# Patient Record
Sex: Male | Born: 1975 | Race: White | Hispanic: No | Marital: Married | Smoking: Former smoker
Health system: Southern US, Community
[De-identification: ages and names within clinical notes are randomized; demographics above are authoritative.]

## PROBLEM LIST (undated history)

## (undated) DIAGNOSIS — N289 Disorder of kidney and ureter, unspecified: Secondary | ICD-10-CM

## (undated) DIAGNOSIS — IMO0002 Reserved for concepts with insufficient information to code with codable children: Secondary | ICD-10-CM

## (undated) DIAGNOSIS — G8929 Other chronic pain: Secondary | ICD-10-CM

## (undated) DIAGNOSIS — Z951 Presence of aortocoronary bypass graft: Secondary | ICD-10-CM

## (undated) DIAGNOSIS — I2510 Atherosclerotic heart disease of native coronary artery without angina pectoris: Secondary | ICD-10-CM

## (undated) DIAGNOSIS — M549 Dorsalgia, unspecified: Secondary | ICD-10-CM

## (undated) DIAGNOSIS — I219 Acute myocardial infarction, unspecified: Secondary | ICD-10-CM

## (undated) HISTORY — PX: CORONARY ARTERY BYPASS GRAFT: SHX141

---

## 2005-04-26 DIAGNOSIS — I219 Acute myocardial infarction, unspecified: Secondary | ICD-10-CM

## 2005-04-26 HISTORY — DX: Acute myocardial infarction, unspecified: I21.9

## 2008-10-16 ENCOUNTER — Emergency Department (HOSPITAL_COMMUNITY): Admission: EM | Admit: 2008-10-16 | Discharge: 2008-10-16 | Payer: Self-pay | Admitting: Emergency Medicine

## 2008-10-29 ENCOUNTER — Emergency Department (HOSPITAL_COMMUNITY): Admission: EM | Admit: 2008-10-29 | Discharge: 2008-10-29 | Payer: Self-pay | Admitting: Internal Medicine

## 2009-01-14 ENCOUNTER — Emergency Department (HOSPITAL_COMMUNITY): Admission: EM | Admit: 2009-01-14 | Discharge: 2009-01-14 | Payer: Self-pay | Admitting: Emergency Medicine

## 2009-01-15 ENCOUNTER — Ambulatory Visit (HOSPITAL_COMMUNITY): Admission: RE | Admit: 2009-01-15 | Discharge: 2009-01-15 | Payer: Self-pay | Admitting: Emergency Medicine

## 2009-10-03 ENCOUNTER — Emergency Department (HOSPITAL_COMMUNITY): Admission: EM | Admit: 2009-10-03 | Discharge: 2009-10-03 | Payer: Self-pay | Admitting: Emergency Medicine

## 2009-10-15 ENCOUNTER — Emergency Department (HOSPITAL_COMMUNITY): Admission: AD | Admit: 2009-10-15 | Discharge: 2009-10-15 | Payer: Self-pay | Admitting: Urology

## 2009-10-20 ENCOUNTER — Encounter (HOSPITAL_COMMUNITY): Admission: RE | Admit: 2009-10-20 | Discharge: 2009-10-20 | Payer: Self-pay | Admitting: Urology

## 2009-10-20 ENCOUNTER — Encounter (INDEPENDENT_AMBULATORY_CARE_PROVIDER_SITE_OTHER): Payer: Self-pay | Admitting: Cardiology

## 2009-10-20 ENCOUNTER — Ambulatory Visit (HOSPITAL_COMMUNITY): Admission: RE | Admit: 2009-10-20 | Discharge: 2009-10-20 | Payer: Self-pay | Admitting: Cardiology

## 2009-10-28 ENCOUNTER — Ambulatory Visit (HOSPITAL_COMMUNITY): Admission: RE | Admit: 2009-10-28 | Discharge: 2009-10-28 | Payer: Self-pay | Admitting: Urology

## 2009-11-06 ENCOUNTER — Ambulatory Visit (HOSPITAL_COMMUNITY): Admission: RE | Admit: 2009-11-06 | Discharge: 2009-11-06 | Payer: Self-pay | Admitting: Urology

## 2010-03-26 ENCOUNTER — Emergency Department (HOSPITAL_COMMUNITY)
Admission: EM | Admit: 2010-03-26 | Discharge: 2010-03-26 | Payer: Self-pay | Source: Home / Self Care | Admitting: Emergency Medicine

## 2010-04-23 ENCOUNTER — Ambulatory Visit (HOSPITAL_COMMUNITY)
Admission: RE | Admit: 2010-04-23 | Discharge: 2010-04-23 | Payer: Self-pay | Source: Home / Self Care | Attending: Urology | Admitting: Urology

## 2010-04-29 ENCOUNTER — Inpatient Hospital Stay (HOSPITAL_COMMUNITY): Admission: EM | Admit: 2010-04-29 | Discharge: 2010-05-01 | Payer: Self-pay | Source: Home / Self Care

## 2010-04-29 LAB — CBC
HCT: 36.7 % — ABNORMAL LOW (ref 39.0–52.0)
Hemoglobin: 12.8 g/dL — ABNORMAL LOW (ref 13.0–17.0)
MCH: 31 pg (ref 26.0–34.0)
MCHC: 34.9 g/dL (ref 30.0–36.0)
MCV: 88.9 fL (ref 78.0–100.0)
Platelets: 303 10*3/uL (ref 150–400)
RBC: 4.13 MIL/uL — ABNORMAL LOW (ref 4.22–5.81)
RDW: 12.4 % (ref 11.5–15.5)
WBC: 15.5 10*3/uL — ABNORMAL HIGH (ref 4.0–10.5)

## 2010-04-29 LAB — BASIC METABOLIC PANEL
BUN: 29 mg/dL — ABNORMAL HIGH (ref 6–23)
CO2: 27 mEq/L (ref 19–32)
Calcium: 9.9 mg/dL (ref 8.4–10.5)
Chloride: 101 mEq/L (ref 96–112)
Creatinine, Ser: 0.88 mg/dL (ref 0.4–1.5)
GFR calc Af Amer: >60 mL/min (ref >60)
GFR calc non Af Amer: >60 mL/min (ref >60)
Glucose, Bld: 113 mg/dL — ABNORMAL HIGH (ref 70–99)
Potassium: 4.2 mEq/L (ref 3.5–5.1)
Sodium: 138 mEq/L (ref 135–145)

## 2010-04-29 LAB — PROTIME-INR
INR: 0.96 (ref 0.00–1.49)
Prothrombin Time: 13 seconds (ref 11.6–15.2)

## 2010-04-29 LAB — DIFFERENTIAL
Basophils Absolute: 0 10*3/uL (ref 0.0–0.1)
Basophils Relative: 0 % (ref 0–1)
Eosinophils Absolute: 0.1 10*3/uL (ref 0.0–0.7)
Eosinophils Relative: 1 % (ref 0–5)
Lymphocytes Relative: 28 % (ref 12–46)
Lymphs Abs: 4.3 10*3/uL — ABNORMAL HIGH (ref 0.7–4.0)
Monocytes Absolute: 1.1 10*3/uL — ABNORMAL HIGH (ref 0.1–1.0)
Monocytes Relative: 7 % (ref 3–12)
Neutro Abs: 10 10*3/uL — ABNORMAL HIGH (ref 1.7–7.7)
Neutrophils Relative %: 64 % (ref 43–77)

## 2010-04-29 LAB — TYPE AND SCREEN
ABO/RH(D): O POS
Antibody Screen: NEGATIVE

## 2010-04-29 LAB — APTT: aPTT: 29 seconds (ref 24–37)

## 2010-04-30 LAB — DIFFERENTIAL
Basophils Absolute: 0.1 10*3/uL (ref 0.0–0.1)
Basophils Absolute: 0 10*3/uL (ref 0.0–0.1)
Basophils Relative: 1 % (ref 0–1)
Basophils Relative: 0 % (ref 0–1)
Eosinophils Absolute: 0.2 10*3/uL (ref 0.0–0.7)
Eosinophils Absolute: 0.1 10*3/uL (ref 0.0–0.7)
Eosinophils Relative: 2 % (ref 0–5)
Eosinophils Relative: 2 % (ref 0–5)
Lymphocytes Relative: 45 % (ref 12–46)
Lymphocytes Relative: 33 % (ref 12–46)
Lymphs Abs: 4.1 10*3/uL — ABNORMAL HIGH (ref 0.7–4.0)
Lymphs Abs: 3.1 10*3/uL (ref 0.7–4.0)
Monocytes Absolute: 0.6 10*3/uL (ref 0.1–1.0)
Monocytes Absolute: 0.7 10*3/uL (ref 0.1–1.0)
Monocytes Relative: 7 % (ref 3–12)
Monocytes Relative: 7 % (ref 3–12)
Neutro Abs: 4.2 10*3/uL (ref 1.7–7.7)
Neutro Abs: 5.5 10*3/uL (ref 1.7–7.7)
Neutrophils Relative %: 45 % (ref 43–77)
Neutrophils Relative %: 59 % (ref 43–77)

## 2010-04-30 LAB — CBC
HCT: 26.6 % — ABNORMAL LOW (ref 39.0–52.0)
HCT: 25.8 % — ABNORMAL LOW (ref 39.0–52.0)
Hemoglobin: 9.3 g/dL — ABNORMAL LOW (ref 13.0–17.0)
Hemoglobin: 9.1 g/dL — ABNORMAL LOW (ref 13.0–17.0)
MCH: 31.2 pg (ref 26.0–34.0)
MCH: 31.5 pg (ref 26.0–34.0)
MCHC: 35 g/dL (ref 30.0–36.0)
MCHC: 35.3 g/dL (ref 30.0–36.0)
MCV: 89.3 fL (ref 78.0–100.0)
MCV: 89.3 fL (ref 78.0–100.0)
Platelets: 217 10*3/uL (ref 150–400)
Platelets: 220 10*3/uL (ref 150–400)
RBC: 2.98 MIL/uL — ABNORMAL LOW (ref 4.22–5.81)
RBC: 2.89 MIL/uL — ABNORMAL LOW (ref 4.22–5.81)
RDW: 12.4 % (ref 11.5–15.5)
RDW: 12.4 % (ref 11.5–15.5)
WBC: 9.2 10*3/uL (ref 4.0–10.5)
WBC: 9.5 10*3/uL (ref 4.0–10.5)

## 2010-05-01 LAB — CBC
HCT: 25.1 % — ABNORMAL LOW (ref 39.0–52.0)
Hemoglobin: 8.9 g/dL — ABNORMAL LOW (ref 13.0–17.0)
MCH: 31.8 pg (ref 26.0–34.0)
MCHC: 35.5 g/dL (ref 30.0–36.0)
MCV: 89.6 fL (ref 78.0–100.0)
Platelets: 219 10*3/uL (ref 150–400)
RBC: 2.8 MIL/uL — ABNORMAL LOW (ref 4.22–5.81)
RDW: 12.2 % (ref 11.5–15.5)
WBC: 9.2 10*3/uL (ref 4.0–10.5)

## 2010-05-01 LAB — COMPREHENSIVE METABOLIC PANEL
ALT: 23 U/L (ref 0–53)
AST: 17 U/L (ref 0–37)
Albumin: 3 g/dL — ABNORMAL LOW (ref 3.5–5.2)
Alkaline Phosphatase: 57 U/L (ref 39–117)
BUN: 10 mg/dL (ref 6–23)
CO2: 29 mEq/L (ref 19–32)
Calcium: 8.4 mg/dL (ref 8.4–10.5)
Chloride: 105 mEq/L (ref 96–112)
Creatinine, Ser: 0.73 mg/dL (ref 0.4–1.5)
GFR calc Af Amer: >60 mL/min (ref >60)
GFR calc non Af Amer: >60 mL/min (ref >60)
Glucose, Bld: 135 mg/dL — ABNORMAL HIGH (ref 70–99)
Potassium: 3.7 mEq/L (ref 3.5–5.1)
Sodium: 139 mEq/L (ref 135–145)
Total Bilirubin: 0.2 mg/dL — ABNORMAL LOW (ref 0.3–1.2)
Total Protein: 5.2 g/dL — ABNORMAL LOW (ref 6.0–8.3)

## 2010-05-01 LAB — DIFFERENTIAL
Basophils Absolute: 0 10*3/uL (ref 0.0–0.1)
Basophils Relative: 0 % (ref 0–1)
Eosinophils Absolute: 0.2 10*3/uL (ref 0.0–0.7)
Eosinophils Relative: 2 % (ref 0–5)
Lymphocytes Relative: 38 % (ref 12–46)
Lymphs Abs: 3.5 10*3/uL (ref 0.7–4.0)
Monocytes Absolute: 0.6 10*3/uL (ref 0.1–1.0)
Monocytes Relative: 7 % (ref 3–12)
Neutro Abs: 4.9 10*3/uL (ref 1.7–7.7)
Neutrophils Relative %: 53 % (ref 43–77)

## 2010-05-01 LAB — H. PYLORI ANTIBODY, IGG: H Pylori IgG: <0.4 {ISR}

## 2010-05-11 LAB — HCV RNA QUANT

## 2010-05-25 ENCOUNTER — Ambulatory Visit: Admit: 2010-05-25 | Payer: Self-pay | Admitting: Internal Medicine

## 2010-07-12 LAB — HEMOGLOBIN AND HEMATOCRIT, BLOOD: Hemoglobin: 15.3 g/dL (ref 13.0–17.0)

## 2010-07-12 LAB — BASIC METABOLIC PANEL
BUN: 12 mg/dL (ref 6–23)
CO2: 28 mEq/L (ref 19–32)
Calcium: 9.6 mg/dL (ref 8.4–10.5)
Chloride: 100 mEq/L (ref 96–112)
GFR calc Af Amer: >60 mL/min (ref >60)
GFR calc Af Amer: >60 mL/min (ref >60)
GFR calc non Af Amer: >60 mL/min (ref >60)
Glucose, Bld: 114 mg/dL — ABNORMAL HIGH (ref 70–99)
Potassium: 4.1 mEq/L (ref 3.5–5.1)
Sodium: 135 mEq/L (ref 135–145)
Sodium: 139 mEq/L (ref 135–145)

## 2010-07-12 LAB — DIFFERENTIAL
Basophils Absolute: 0.1 10*3/uL (ref 0.0–0.1)
Basophils Relative: 1 % (ref 0–1)
Eosinophils Absolute: 0.2 10*3/uL (ref 0.0–0.7)
Eosinophils Relative: 2 % (ref 0–5)
Monocytes Absolute: 0.8 10*3/uL (ref 0.1–1.0)

## 2010-07-12 LAB — CBC
HCT: 43.8 % (ref 39.0–52.0)
RDW: 12.2 % (ref 11.5–15.5)
WBC: 10.8 10*3/uL — ABNORMAL HIGH (ref 4.0–10.5)

## 2010-07-12 LAB — POCT CARDIAC MARKERS: Myoglobin, poc: 66.3 ng/mL (ref 12–200)

## 2010-07-13 LAB — URINALYSIS, ROUTINE W REFLEX MICROSCOPIC
Bilirubin Urine: NEGATIVE
Glucose, UA: NEGATIVE mg/dL
Ketones, ur: NEGATIVE mg/dL
Leukocytes, UA: NEGATIVE
Specific Gravity, Urine: 1.025 (ref 1.005–1.030)
pH: 5.5 (ref 5.0–8.0)

## 2010-07-13 LAB — BASIC METABOLIC PANEL
BUN: 12 mg/dL (ref 6–23)
CO2: 27 mEq/L (ref 19–32)
Chloride: 102 mEq/L (ref 96–112)
Creatinine, Ser: 1.12 mg/dL (ref 0.4–1.5)
GFR calc Af Amer: >60 mL/min (ref >60)
GFR calc non Af Amer: >60 mL/min (ref >60)
Glucose, Bld: 103 mg/dL — ABNORMAL HIGH (ref 70–99)
Potassium: 4 mEq/L (ref 3.5–5.1)

## 2010-07-13 LAB — DIFFERENTIAL
Basophils Absolute: 0.1 10*3/uL (ref 0.0–0.1)
Basophils Relative: 0 % (ref 0–1)
Eosinophils Absolute: 0.2 10*3/uL (ref 0.0–0.7)
Lymphs Abs: 2.1 10*3/uL (ref 0.7–4.0)
Neutro Abs: 11 10*3/uL — ABNORMAL HIGH (ref 1.7–7.7)

## 2010-07-13 LAB — URINE MICROSCOPIC-ADD ON

## 2010-07-13 LAB — CBC: WBC: 14.5 10*3/uL — ABNORMAL HIGH (ref 4.0–10.5)

## 2010-07-31 LAB — URINALYSIS, ROUTINE W REFLEX MICROSCOPIC
Ketones, ur: NEGATIVE mg/dL
Nitrite: NEGATIVE
Protein, ur: NEGATIVE mg/dL
Urobilinogen, UA: 0.2 mg/dL (ref 0.0–1.0)

## 2010-07-31 LAB — CBC
HCT: 42.9 % (ref 39.0–52.0)
Hemoglobin: 15 g/dL (ref 13.0–17.0)
MCHC: 35 g/dL (ref 30.0–36.0)
MCV: 91.1 fL (ref 78.0–100.0)
Platelets: 260 10*3/uL (ref 150–400)
RBC: 4.71 MIL/uL (ref 4.22–5.81)
RDW: 12.2 % (ref 11.5–15.5)
WBC: 8.8 10*3/uL (ref 4.0–10.5)

## 2010-07-31 LAB — DIFFERENTIAL
Basophils Absolute: 0 10*3/uL (ref 0.0–0.1)
Basophils Relative: 0 % (ref 0–1)
Monocytes Absolute: 0.6 10*3/uL (ref 0.1–1.0)
Monocytes Relative: 7 % (ref 3–12)
Neutrophils Relative %: 71 % (ref 43–77)

## 2010-07-31 LAB — COMPREHENSIVE METABOLIC PANEL
Alkaline Phosphatase: 81 U/L (ref 39–117)
CO2: 28 mEq/L (ref 19–32)
GFR calc Af Amer: >60 mL/min (ref >60)
GFR calc non Af Amer: >60 mL/min (ref >60)
Total Bilirubin: 0.6 mg/dL (ref 0.3–1.2)
Total Protein: 7.6 g/dL (ref 6.0–8.3)

## 2010-08-03 LAB — URINALYSIS, ROUTINE W REFLEX MICROSCOPIC
Bilirubin Urine: NEGATIVE
Hgb urine dipstick: NEGATIVE
Ketones, ur: NEGATIVE mg/dL
Nitrite: NEGATIVE
Urobilinogen, UA: 0.2 mg/dL (ref 0.0–1.0)

## 2010-09-29 ENCOUNTER — Ambulatory Visit (INDEPENDENT_AMBULATORY_CARE_PROVIDER_SITE_OTHER): Payer: Self-pay | Admitting: Internal Medicine

## 2011-05-27 ENCOUNTER — Encounter (HOSPITAL_COMMUNITY): Payer: Self-pay | Admitting: Emergency Medicine

## 2011-05-27 ENCOUNTER — Emergency Department (HOSPITAL_COMMUNITY)
Admission: EM | Admit: 2011-05-27 | Discharge: 2011-05-27 | Disposition: A | Discharge status: Home / Self Care | Payer: Medicaid Other | Attending: Emergency Medicine | Admitting: Emergency Medicine

## 2011-05-27 DIAGNOSIS — M545 Low back pain, unspecified: Secondary | ICD-10-CM | POA: Insufficient documentation

## 2011-05-27 DIAGNOSIS — G8929 Other chronic pain: Secondary | ICD-10-CM | POA: Insufficient documentation

## 2011-05-27 DIAGNOSIS — R52 Pain, unspecified: Secondary | ICD-10-CM | POA: Insufficient documentation

## 2011-05-27 DIAGNOSIS — Z951 Presence of aortocoronary bypass graft: Secondary | ICD-10-CM | POA: Insufficient documentation

## 2011-05-27 DIAGNOSIS — I2510 Atherosclerotic heart disease of native coronary artery without angina pectoris: Secondary | ICD-10-CM | POA: Insufficient documentation

## 2011-05-27 HISTORY — DX: Atherosclerotic heart disease of native coronary artery without angina pectoris: I25.10

## 2011-05-27 HISTORY — DX: Other chronic pain: G89.29

## 2011-05-27 HISTORY — DX: Dorsalgia, unspecified: M54.9

## 2011-05-27 MED ORDER — OXYCODONE-ACETAMINOPHEN 5-325 MG PO TABS: ORAL_TABLET | ORAL | Status: AC

## 2011-05-27 MED ORDER — METHOCARBAMOL 500 MG PO TABS: 1000.0000 mg | ORAL_TABLET | Freq: Four times a day (QID) | ORAL | Status: AC | PRN

## 2011-05-27 MED ORDER — OXYCODONE-ACETAMINOPHEN 5-325 MG PO TABS
2.0000 | ORAL_TABLET | Freq: Once | ORAL | Status: AC
  Administered 2011-05-27: 2 via ORAL
  Filled 2011-05-27: qty 2

## 2011-05-27 NOTE — ED Notes (Signed)
Received pt. From triage via w/c with c/o back pain, pt. Alert and oriented, pt. Transferred to stretcher without incident, NAD noted

## 2011-05-27 NOTE — ED Provider Notes (Signed)
History     CSN: [id number]  Arrival date & time 05/27/11  0139   Chief Complaint  Patient presents with  . Back Pain    HPI Pt was seen at 0215.  Per pt, c/o gradual onset and persistence of constant acute flair of his chronic left sided low back "pain" for the past several days.  Denies any change in his usual chronic pain pattern for many years.  Denies incont/retention of bowel or bladder, no saddle anesthesia, no focal motor weakness, no tingling/numbness in extremities, no fevers, no injury.   The symptoms have been associated with no other complaints. The patient has a significant history of similar symptoms previously.     Past Medical History  Diagnosis Date  . Back pain, chronic   . Coronary artery disease     Past Surgical History  Procedure Date  . Coronary artery bypass graft     History  Substance Use Topics  . Smoking status: Current Everyday Smoker  . Smokeless tobacco: Not on file  . Alcohol Use: Yes    Review of Systems ROS: Statement: All systems negative except as marked or noted in the HPI; Constitutional: Negative for fever and chills. ; ; Eyes: Negative for eye pain, redness and discharge. ; ; ENMT: Negative for ear pain, hoarseness, nasal congestion, sinus pressure and sore throat. ; ; Cardiovascular: Negative for chest pain, palpitations, diaphoresis, dyspnea and peripheral edema. ; ; Respiratory: Negative for cough, wheezing and stridor. ; ; Gastrointestinal: Negative for nausea, vomiting, diarrhea, abdominal pain, blood in stool, hematemesis, jaundice and rectal bleeding. . ; ; Genitourinary: Negative for dysuria, flank pain and hematuria. ; ; Musculoskeletal: +LBP.  Negative for neck pain. Negative for swelling and trauma.; ; Skin: Negative for pruritus, rash, abrasions, blisters, bruising and skin lesion.; ; Neuro: Negative for headache, lightheadedness and neck stiffness. Negative for weakness, altered level of consciousness , altered mental status,  extremity weakness, paresthesias, involuntary movement, seizure and syncope.     Allergies  Darvocet  Home Medications   Current Outpatient Rx  Name Route Sig Dispense Refill  . METHOCARBAMOL 500 MG PO TABS Oral Take 2 tablets (1,000 mg total) by mouth 4 (four) times daily as needed. 15 tablet 0  . OXYCODONE-ACETAMINOPHEN 5-325 MG PO TABS  1 or 2 tabs PO q6h prn pain 20 tablet 0    BP 128/90  Pulse 91  Temp(Src) 97.8 F (36.6 C) (Oral)  Resp 15  SpO2 100%  Physical Exam 0220: Physical examination:  Nursing notes reviewed; Vital signs and O2 SAT reviewed;  Constitutional: Well developed, Well nourished, Well hydrated, In no acute distress; Head:  Normocephalic, atraumatic; Eyes: EOMI, PERRL, No scleral icterus; ENMT: Mouth and pharynx normal, Mucous membranes moist; Neck: Supple, Full range of motion, No lymphadenopathy; Cardiovascular: Regular rate and rhythm, No murmur, rub, or gallop; Respiratory: Breath sounds clear & equal bilaterally, No rales, rhonchi, wheezes, or rub, Normal respiratory effort/excursion; Chest: Nontender, Movement normal; Abdomen: Soft, Nontender, Nondistended, Normal bowel sounds; Genitourinary: No CVA tenderness;  Spine:  No midline CS, TS, LS tenderness.  +TTP left lumbar paraspinal muscles.; Extremities: Pulses normal, No tenderness, No edema, No calf edema or asymmetry.; Neuro: AA&Ox3, Major CN grossly intact.  Strength 5/5 equal bilat UE's and LE's, including great toe dorsiflexion.  DTR 2/4 equal bilat UE's and LE's.  No gross sensory deficits.  Neg straight leg raises bilat. No gross focal motor deficits in extremities.; Skin: Color normal, Warm, Dry, no rash.  ED Course  Procedures   MDM  MDM Reviewed: previous chart, nursing note and vitals Reviewed previous: MRI       Hx chronic LBP, acute flair of chronic pain today.  States only med that works for his pain in [employer].  Will tx symptomatically, encouraged to f/u with Pain Management or PMD  for his chronic pain meds management.  Verb understanding.   Laray Anger, DO 05/28/11 2027

## 2011-05-27 NOTE — ED Notes (Signed)
PT. REPORTS CHRONIC LOW BACK PAIN RADIATING TO LEFT LEG , DENIES RECENT FALL OR INJURY.

## 2011-05-27 NOTE — ED Notes (Signed)
Pt. Discharged to home via w/c, NAD noted 

## 2011-08-16 ENCOUNTER — Encounter (HOSPITAL_BASED_OUTPATIENT_CLINIC_OR_DEPARTMENT_OTHER): Admission: RE | Payer: Self-pay | Source: Ambulatory Visit

## 2011-08-16 ENCOUNTER — Ambulatory Visit (HOSPITAL_BASED_OUTPATIENT_CLINIC_OR_DEPARTMENT_OTHER): Admission: RE | Admit: 2011-08-16 | Payer: Medicaid Other | Source: Ambulatory Visit | Admitting: Orthopedic Surgery

## 2011-08-16 SURGERY — MINOR STEROID INJECTION
Anesthesia: LOCAL | Laterality: Left

## 2011-09-02 ENCOUNTER — Encounter (HOSPITAL_BASED_OUTPATIENT_CLINIC_OR_DEPARTMENT_OTHER)
Admission: RE | Disposition: A | Discharge status: Home / Self Care | Payer: Self-pay | Source: Ambulatory Visit | Attending: Orthopedic Surgery

## 2011-09-02 ENCOUNTER — Ambulatory Visit (HOSPITAL_BASED_OUTPATIENT_CLINIC_OR_DEPARTMENT_OTHER)
Admission: RE | Admit: 2011-09-02 | Discharge: 2011-09-02 | Disposition: A | Discharge status: Home / Self Care | Payer: Medicaid Other | Source: Ambulatory Visit | Attending: Orthopedic Surgery | Admitting: Orthopedic Surgery

## 2011-09-02 ENCOUNTER — Ambulatory Visit (HOSPITAL_COMMUNITY): Payer: Medicaid Other

## 2011-09-02 ENCOUNTER — Encounter (HOSPITAL_BASED_OUTPATIENT_CLINIC_OR_DEPARTMENT_OTHER): Payer: Self-pay | Admitting: *Deleted

## 2011-09-02 DIAGNOSIS — M48061 Spinal stenosis, lumbar region without neurogenic claudication: Secondary | ICD-10-CM | POA: Insufficient documentation

## 2011-09-02 HISTORY — PX: STERIOD INJECTION: SHX5046

## 2011-09-02 SURGERY — MINOR STEROID INJECTION
Anesthesia: LOCAL | Site: Back | Laterality: Left | Wound class: Clean

## 2011-09-02 MED ORDER — IOHEXOL 300 MG/ML  SOLN
INTRAMUSCULAR | Status: DC | PRN
  Administered 2011-09-02: 2 mL via EPIDURAL

## 2011-09-02 MED ORDER — METHYLPREDNISOLONE ACETATE 40 MG/ML IJ SUSP
INTRAMUSCULAR | Status: DC | PRN
  Administered 2011-09-02: 14:00:00 via EPIDURAL

## 2011-09-02 SURGICAL SUPPLY — 14 items
BANDAGE ADHESIVE 1X3 (GAUZE/BANDAGES/DRESSINGS) ×2 IMPLANT
CHLORAPREP W/TINT 26ML (MISCELLANEOUS) ×2 IMPLANT
GLOVE BIO SURGEON STRL SZ 6.5 (GLOVE) ×2 IMPLANT
GLOVE SS BIOGEL STRL SZ 8.5 (GLOVE) ×1 IMPLANT
GLOVE SUPERSENSE BIOGEL SZ 8.5 (GLOVE) ×1
NDL SAFETY ECLIPSE 18X1.5 (NEEDLE) ×2 IMPLANT
NEEDLE HYPO 18GX1.5 SHARP (NEEDLE) ×2
NEEDLE SPNL 22GX3.5 QUINCKE BK (NEEDLE) ×2 IMPLANT
NEEDLE SPNL 22GX5 LNG QUINC BK (NEEDLE) IMPLANT
NEEDLE SPNL 22GX7 QUINCKE BK (NEEDLE) IMPLANT
SYR 3ML 23GX1 SAFETY (SYRINGE) ×2 IMPLANT
SYR 5ML LL (SYRINGE) ×2 IMPLANT
SYR CONTROL 10ML LL (SYRINGE) IMPLANT
TOWEL OR 17X24 6PK STRL BLUE (TOWEL DISPOSABLE) ×2 IMPLANT

## 2011-09-02 NOTE — Op Note (Signed)
Preprocedure diagnosis: Lumbar stenosis L4-5, L5-S1 Postprocedure diagnoses the same  Procedure: The patient was placed prone on radiolucent table. The lumbar area was prepped with chlorhexidine. 2 cc of 1% plain preservative-free lidocaine were mixed with 2 cc of quarter percent preservative-free plain Marcaine and 40 mg of Depo-Medrol and 5 cc syringe. Omnipaque was drawn into a 3 cc syringe.  The tip of a 25-gauge needle is positioned at the exits of the left L5-S1 neural foramen. Omnipaque was injected and flowed epidurally. 1.5 cc of injectate was delivered. The needle was withdrawn. The needle was then repositioned at the left L4-5 neural foramen. Omnipaque was injected and flowed epidurally. 1.5 cc of injectate was delivered. During both injections the patient had some leg pain.  Digital pressure was applied to the needle tract as a result small amount of bleeding. Band-Aids were applied to the wound.

## 2011-09-03 ENCOUNTER — Encounter (HOSPITAL_BASED_OUTPATIENT_CLINIC_OR_DEPARTMENT_OTHER): Payer: Self-pay | Admitting: Orthopedic Surgery

## 2012-01-06 ENCOUNTER — Emergency Department (HOSPITAL_COMMUNITY): Payer: Medicaid Other

## 2012-01-06 ENCOUNTER — Encounter (HOSPITAL_COMMUNITY)
Admission: EM | Disposition: A | Discharge status: Home / Self Care | Payer: Self-pay | Source: Home / Self Care | Attending: Cardiothoracic Surgery

## 2012-01-06 ENCOUNTER — Inpatient Hospital Stay (HOSPITAL_COMMUNITY)
Admission: EM | Admit: 2012-01-06 | Discharge: 2012-01-15 | DRG: 234 | Disposition: A | Discharge status: Home / Self Care | Payer: Medicaid Other | Attending: Cardiothoracic Surgery | Admitting: Cardiothoracic Surgery

## 2012-01-06 ENCOUNTER — Encounter (HOSPITAL_COMMUNITY): Payer: Self-pay

## 2012-01-06 ENCOUNTER — Other Ambulatory Visit: Payer: Self-pay

## 2012-01-06 ENCOUNTER — Other Ambulatory Visit: Payer: Self-pay | Admitting: *Deleted

## 2012-01-06 DIAGNOSIS — M549 Dorsalgia, unspecified: Secondary | ICD-10-CM | POA: Diagnosis present

## 2012-01-06 DIAGNOSIS — J90 Pleural effusion, not elsewhere classified: Secondary | ICD-10-CM | POA: Diagnosis not present

## 2012-01-06 DIAGNOSIS — I10 Essential (primary) hypertension: Secondary | ICD-10-CM | POA: Diagnosis present

## 2012-01-06 DIAGNOSIS — D72829 Elevated white blood cell count, unspecified: Secondary | ICD-10-CM | POA: Diagnosis not present

## 2012-01-06 DIAGNOSIS — F1410 Cocaine abuse, uncomplicated: Secondary | ICD-10-CM | POA: Diagnosis present

## 2012-01-06 DIAGNOSIS — D62 Acute posthemorrhagic anemia: Secondary | ICD-10-CM | POA: Diagnosis not present

## 2012-01-06 DIAGNOSIS — I200 Unstable angina: Secondary | ICD-10-CM | POA: Diagnosis present

## 2012-01-06 DIAGNOSIS — R7309 Other abnormal glucose: Secondary | ICD-10-CM | POA: Diagnosis present

## 2012-01-06 DIAGNOSIS — E782 Mixed hyperlipidemia: Secondary | ICD-10-CM | POA: Diagnosis present

## 2012-01-06 DIAGNOSIS — I2510 Atherosclerotic heart disease of native coronary artery without angina pectoris: Secondary | ICD-10-CM | POA: Diagnosis present

## 2012-01-06 DIAGNOSIS — Z951 Presence of aortocoronary bypass graft: Secondary | ICD-10-CM

## 2012-01-06 DIAGNOSIS — F17200 Nicotine dependence, unspecified, uncomplicated: Secondary | ICD-10-CM | POA: Diagnosis present

## 2012-01-06 DIAGNOSIS — I214 Non-ST elevation (NSTEMI) myocardial infarction: Principal | ICD-10-CM | POA: Diagnosis present

## 2012-01-06 DIAGNOSIS — E785 Hyperlipidemia, unspecified: Secondary | ICD-10-CM | POA: Diagnosis present

## 2012-01-06 DIAGNOSIS — I252 Old myocardial infarction: Secondary | ICD-10-CM

## 2012-01-06 DIAGNOSIS — I259 Chronic ischemic heart disease, unspecified: Secondary | ICD-10-CM | POA: Diagnosis present

## 2012-01-06 DIAGNOSIS — E8779 Other fluid overload: Secondary | ICD-10-CM | POA: Diagnosis not present

## 2012-01-06 HISTORY — DX: Acute myocardial infarction, unspecified: I21.9

## 2012-01-06 HISTORY — DX: Reserved for concepts with insufficient information to code with codable children: IMO0002

## 2012-01-06 HISTORY — DX: Disorder of kidney and ureter, unspecified: N28.9

## 2012-01-06 HISTORY — DX: Presence of aortocoronary bypass graft: Z95.1

## 2012-01-06 HISTORY — PX: LEFT HEART CATHETERIZATION WITH CORONARY/GRAFT ANGIOGRAM: SHX5450

## 2012-01-06 LAB — URINE MICROSCOPIC-ADD ON

## 2012-01-06 LAB — CK TOTAL AND CKMB (NOT AT ARMC)
CK, MB: 46.4 ng/mL (ref 0.3–4.0)
Relative Index: 12.8 — ABNORMAL HIGH (ref 0.0–2.5)
Total CK: 362 U/L — ABNORMAL HIGH (ref 7–232)

## 2012-01-06 LAB — COMPREHENSIVE METABOLIC PANEL
Albumin: 3.9 g/dL (ref 3.5–5.2)
Alkaline Phosphatase: 84 U/L (ref 39–117)
BUN: 10 mg/dL (ref 6–23)
Calcium: 10 mg/dL (ref 8.4–10.5)
GFR calc Af Amer: >90 mL/min (ref >90)
Glucose, Bld: 141 mg/dL — ABNORMAL HIGH (ref 70–99)
Potassium: 4.1 mEq/L (ref 3.5–5.1)
Sodium: 138 mEq/L (ref 135–145)
Total Protein: 7.1 g/dL (ref 6.0–8.3)

## 2012-01-06 LAB — URINALYSIS, ROUTINE W REFLEX MICROSCOPIC
Bilirubin Urine: NEGATIVE
Leukocytes, UA: NEGATIVE
Nitrite: NEGATIVE
Specific Gravity, Urine: 1.012 (ref 1.005–1.030)
Urobilinogen, UA: 0.2 mg/dL (ref 0.0–1.0)
pH: 7 (ref 5.0–8.0)

## 2012-01-06 LAB — RAPID URINE DRUG SCREEN, HOSP PERFORMED
Barbiturates: NOT DETECTED
Benzodiazepines: NOT DETECTED
Cocaine: NOT DETECTED
Opiates: NOT DETECTED

## 2012-01-06 LAB — TROPONIN I: Troponin I: <0.3 ng/mL (ref <0.30)

## 2012-01-06 LAB — CBC WITH DIFFERENTIAL/PLATELET
Basophils Relative: 0 % (ref 0–1)
Eosinophils Absolute: 0.2 10*3/uL (ref 0.0–0.7)
Eosinophils Relative: 1 % (ref 0–5)
MCH: 32.2 pg (ref 26.0–34.0)
MCHC: 35.5 g/dL (ref 30.0–36.0)
MCV: 90.7 fL (ref 78.0–100.0)
Monocytes Relative: 8 % (ref 3–12)
Neutrophils Relative %: 76 % (ref 43–77)
Platelets: 311 10*3/uL (ref 150–400)

## 2012-01-06 LAB — LIPID PANEL
Cholesterol: 228 mg/dL — ABNORMAL HIGH (ref 0–200)
LDL Cholesterol: UNDETERMINED mg/dL (ref 0–99)
Total CHOL/HDL Ratio: 7.9 RATIO
VLDL: UNDETERMINED mg/dL (ref 0–40)

## 2012-01-06 LAB — SURGICAL PCR SCREEN: Staphylococcus aureus: POSITIVE — AB

## 2012-01-06 SURGERY — LEFT HEART CATHETERIZATION WITH CORONARY/GRAFT ANGIOGRAM

## 2012-01-06 MED ORDER — ONDANSETRON HCL 4 MG/2ML IJ SOLN: 4.0000 mg | Freq: Four times a day (QID) | INTRAMUSCULAR | Status: DC | PRN

## 2012-01-06 MED ORDER — LIDOCAINE HCL (PF) 1 % IJ SOLN
INTRAMUSCULAR | Status: AC
  Filled 2012-01-06: qty 30

## 2012-01-06 MED ORDER — ACETAMINOPHEN 325 MG PO TABS: 650.0000 mg | ORAL_TABLET | ORAL | Status: DC | PRN

## 2012-01-06 MED ORDER — BISACODYL 5 MG PO TBEC
5.0000 mg | DELAYED_RELEASE_TABLET | Freq: Once | ORAL | Status: AC
  Administered 2012-01-09: 5 mg via ORAL
  Filled 2012-01-06: qty 1

## 2012-01-06 MED ORDER — CHLORHEXIDINE GLUCONATE CLOTH 2 % EX PADS
6.0000 | MEDICATED_PAD | Freq: Every day | CUTANEOUS | Status: AC
Start: 2012-01-07 — End: 2012-01-12
  Administered 2012-01-07 – 2012-01-11 (×4): 6 via TOPICAL

## 2012-01-06 MED ORDER — MUPIROCIN 2 % EX OINT
1.0000 "application " | TOPICAL_OINTMENT | Freq: Two times a day (BID) | CUTANEOUS | Status: AC
  Administered 2012-01-06 – 2012-01-11 (×8): 1 via NASAL
  Filled 2012-01-06 (×3): qty 22

## 2012-01-06 MED ORDER — ASPIRIN 325 MG PO TABS: 325.0000 mg | ORAL_TABLET | Freq: Every day | ORAL | Status: DC

## 2012-01-06 MED ORDER — CARVEDILOL 6.25 MG PO TABS
6.2500 mg | ORAL_TABLET | Freq: Two times a day (BID) | ORAL | Status: DC
  Administered 2012-01-06 – 2012-01-07 (×2): 6.25 mg via ORAL
  Filled 2012-01-06 (×4): qty 1

## 2012-01-06 MED ORDER — ASPIRIN 81 MG PO CHEW
81.0000 mg | CHEWABLE_TABLET | Freq: Every day | ORAL | Status: DC
  Administered 2012-01-07 – 2012-01-09 (×3): 81 mg via ORAL
  Filled 2012-01-06 (×3): qty 1

## 2012-01-06 MED ORDER — SODIUM CHLORIDE 0.9 % IJ SOLN
3.0000 mL | Freq: Two times a day (BID) | INTRAMUSCULAR | Status: DC
  Administered 2012-01-07 – 2012-01-09 (×2): 3 mL via INTRAVENOUS

## 2012-01-06 MED ORDER — ASPIRIN 81 MG PO CHEW: 324.0000 mg | CHEWABLE_TABLET | ORAL | Status: DC

## 2012-01-06 MED ORDER — SIMVASTATIN 40 MG PO TABS
40.0000 mg | ORAL_TABLET | Freq: Every evening | ORAL | Status: DC
  Administered 2012-01-06: 40 mg via ORAL
  Filled 2012-01-06 (×2): qty 1

## 2012-01-06 MED ORDER — OXYCODONE-ACETAMINOPHEN 5-325 MG PO TABS
1.0000 | ORAL_TABLET | ORAL | Status: DC | PRN
  Administered 2012-01-06 – 2012-01-08 (×11): 2 via ORAL
  Administered 2012-01-09: 1 via ORAL
  Administered 2012-01-09 (×2): 2 via ORAL
  Administered 2012-01-09: 1 via ORAL
  Administered 2012-01-09 – 2012-01-10 (×3): 2 via ORAL
  Filled 2012-01-06: qty 1
  Filled 2012-01-06 (×5): qty 2
  Filled 2012-01-06: qty 1
  Filled 2012-01-06 (×8): qty 2
  Filled 2012-01-06: qty 1
  Filled 2012-01-06: qty 2
  Filled 2012-01-06: qty 1
  Filled 2012-01-06: qty 2

## 2012-01-06 MED ORDER — SODIUM CHLORIDE 0.9 % IJ SOLN: 3.0000 mL | Freq: Two times a day (BID) | INTRAMUSCULAR | Status: DC

## 2012-01-06 MED ORDER — HYDROMORPHONE HCL PF 2 MG/ML IJ SOLN
INTRAMUSCULAR | Status: AC
  Filled 2012-01-06: qty 1

## 2012-01-06 MED ORDER — SODIUM CHLORIDE 0.9 % IV SOLN: 250.0000 mL | INTRAVENOUS | Status: DC

## 2012-01-06 MED ORDER — HYDROMORPHONE HCL PF 1 MG/ML IJ SOLN
1.0000 mg | INTRAMUSCULAR | Status: DC | PRN
  Administered 2012-01-06 – 2012-01-07 (×4): 1 mg via INTRAVENOUS
  Filled 2012-01-06 (×3): qty 1

## 2012-01-06 MED ORDER — ASPIRIN EC 81 MG PO TBEC: 81.0000 mg | DELAYED_RELEASE_TABLET | Freq: Every day | ORAL | Status: DC

## 2012-01-06 MED ORDER — NITROGLYCERIN 0.2 MG/ML ON CALL CATH LAB
INTRAVENOUS | Status: AC
  Filled 2012-01-06: qty 1

## 2012-01-06 MED ORDER — SODIUM CHLORIDE 0.9 % IJ SOLN: 3.0000 mL | INTRAMUSCULAR | Status: DC | PRN

## 2012-01-06 MED ORDER — HYDROMORPHONE HCL PF 1 MG/ML IJ SOLN
1.0000 mg | Freq: Once | INTRAMUSCULAR | Status: AC
  Administered 2012-01-06: 1 mg via INTRAVENOUS
  Filled 2012-01-06: qty 1

## 2012-01-06 MED ORDER — BUDESONIDE-FORMOTEROL FUMARATE 160-4.5 MCG/ACT IN AERO
2.0000 | INHALATION_SPRAY | Freq: Two times a day (BID) | RESPIRATORY_TRACT | Status: DC
  Administered 2012-01-06 – 2012-01-09 (×7): 2 via RESPIRATORY_TRACT
  Filled 2012-01-06: qty 6

## 2012-01-06 MED ORDER — NITROGLYCERIN 0.4 MG SL SUBL: 0.4000 mg | SUBLINGUAL_TABLET | SUBLINGUAL | Status: DC | PRN

## 2012-01-06 MED ORDER — CHLORHEXIDINE GLUCONATE 4 % EX LIQD
60.0000 mL | Freq: Once | CUTANEOUS | Status: AC
  Administered 2012-01-09: 4 via TOPICAL
  Filled 2012-01-06: qty 60

## 2012-01-06 MED ORDER — METOPROLOL TARTRATE 12.5 MG HALF TABLET: 12.5000 mg | ORAL_TABLET | Freq: Once | ORAL | Status: DC

## 2012-01-06 MED ORDER — SODIUM CHLORIDE 0.9 % IV SOLN: INTRAVENOUS | Status: DC

## 2012-01-06 MED ORDER — SODIUM CHLORIDE 0.9 % IV SOLN: 1.0000 mL/kg/h | INTRAVENOUS | Status: AC

## 2012-01-06 MED ORDER — HYDROMORPHONE HCL PF 1 MG/ML IJ SOLN
INTRAMUSCULAR | Status: AC
  Filled 2012-01-06: qty 1

## 2012-01-06 MED ORDER — HEPARIN (PORCINE) IN NACL 2-0.9 UNIT/ML-% IJ SOLN
INTRAMUSCULAR | Status: AC
  Filled 2012-01-06: qty 1000

## 2012-01-06 MED ORDER — ASPIRIN 300 MG RE SUPP: 300.0000 mg | RECTAL | Status: DC

## 2012-01-06 MED ORDER — OMEGA-3-ACID ETHYL ESTERS 1 G PO CAPS
2.0000 g | ORAL_CAPSULE | Freq: Two times a day (BID) | ORAL | Status: DC
  Administered 2012-01-06 – 2012-01-09 (×7): 2 g via ORAL
  Filled 2012-01-06 (×9): qty 2

## 2012-01-06 MED ORDER — ZOLPIDEM TARTRATE 5 MG PO TABS: 5.0000 mg | ORAL_TABLET | Freq: Every evening | ORAL | Status: DC | PRN

## 2012-01-06 MED ORDER — ALPRAZOLAM 0.5 MG PO TABS
1.0000 mg | ORAL_TABLET | Freq: Three times a day (TID) | ORAL | Status: DC | PRN
  Administered 2012-01-06 – 2012-01-09 (×5): 1 mg via ORAL
  Filled 2012-01-06: qty 2
  Filled 2012-01-06: qty 1
  Filled 2012-01-06 (×2): qty 2
  Filled 2012-01-06: qty 1
  Filled 2012-01-06: qty 2

## 2012-01-06 MED ORDER — CHLORHEXIDINE GLUCONATE 4 % EX LIQD: 60.0000 mL | Freq: Once | CUTANEOUS | Status: DC

## 2012-01-06 MED ORDER — TEMAZEPAM 15 MG PO CAPS: 15.0000 mg | ORAL_CAPSULE | Freq: Once | ORAL | Status: AC | PRN

## 2012-01-06 MED ORDER — MORPHINE SULFATE 4 MG/ML IJ SOLN
4.0000 mg | Freq: Once | INTRAMUSCULAR | Status: AC
  Administered 2012-01-06: 4 mg via INTRAVENOUS
  Filled 2012-01-06: qty 1

## 2012-01-06 MED ORDER — BENAZEPRIL HCL 20 MG PO TABS
20.0000 mg | ORAL_TABLET | Freq: Every day | ORAL | Status: DC
  Administered 2012-01-07 – 2012-01-09 (×3): 20 mg via ORAL
  Filled 2012-01-06 (×4): qty 1

## 2012-01-06 MED ORDER — PANTOPRAZOLE SODIUM 40 MG IV SOLR
40.0000 mg | INTRAVENOUS | Status: DC
  Administered 2012-01-06: 40 mg via INTRAVENOUS
  Filled 2012-01-06 (×2): qty 40

## 2012-01-06 MED ORDER — HEPARIN (PORCINE) IN NACL 100-0.45 UNIT/ML-% IJ SOLN
1450.0000 [IU]/h | INTRAMUSCULAR | Status: DC
  Administered 2012-01-06: 900 [IU]/h via INTRAVENOUS
  Filled 2012-01-06 (×3): qty 250

## 2012-01-06 MED ORDER — NITROGLYCERIN IN D5W 200-5 MCG/ML-% IV SOLN
2.0000 ug/min | Freq: Once | INTRAVENOUS | Status: AC
  Administered 2012-01-06: 5 ug/min via INTRAVENOUS
  Filled 2012-01-06: qty 250

## 2012-01-06 MED ORDER — NITROGLYCERIN IN D5W 200-5 MCG/ML-% IV SOLN
2.0000 ug/min | Freq: Once | INTRAVENOUS | Status: AC
  Administered 2012-01-07: 20 ug/min via INTRAVENOUS
  Filled 2012-01-06 (×2): qty 250

## 2012-01-06 MED ORDER — SODIUM CHLORIDE 0.9 % IV SOLN: 250.0000 mL | INTRAVENOUS | Status: DC | PRN

## 2012-01-06 MED ORDER — MIDAZOLAM HCL 2 MG/2ML IJ SOLN
INTRAMUSCULAR | Status: AC
  Filled 2012-01-06: qty 2

## 2012-01-06 NOTE — Progress Notes (Signed)
[address]            [place] [address]          [phone]       IDA VENKATARAMAN [facility] Medical Record #[id number] Date of Birth: Apr 21, 1976  No ref. provider found Eartha Inch, MD  Chief Complaint:    Chief Complaint  Patient presents with  . Chest Pain    History of Present Illness:     36 year old male smoker status post CABG x2 at [facility] 2008, (note pending but grafts appear to be done to the right coronary) admitted to the hospital today with unstable angina. Initial set of enzymes were negative. A second set of enzymes show an elevated troponin. EKG shows no change. Patient has EF of 40% with inferior lateral hypokinesia, occluded vein graft to the RCA distribution, 80% LAD stenosis, occlusion of a circumflex marginal, 70% stenosis the ramus intermediate. The distal right fills faintly from left to right collaterals. LVEDP is 14 mm mercury. No evidence of aortic stenosis at the time of cardiac catheterization. 2-D echo is pending.  Patient is currently stable in sinus rhythm on IV heparin and nitroglycerin. Patient states he recovered fairly well from his initial heart bypass operation.  Patient has a past history significant for gastric ulcer 2 years ago, multiple kidney stones, low back pain requiring steroid epidural injection, and fibromyalgia-peripheral neuropathy. He states he is a prediabetic. He smokes one pack of cigarettes a day. He has a previous history of cocaine abuse but is rapid drug screen on presentation to our ED is negative. Current Activity/ Functional Status: Currently unemployed or disabled   Past Medical History  Diagnosis Date  . Back pain, chronic   . Coronary artery disease   . MI (myocardial infarction) 2007    has had two  . Herniated disc   . Renal disorder     kidney stones  . Hx of CABG     Past Surgical History  Procedure Date  . Coronary artery bypass graft   .  Steriod injection 09/02/2011    Procedure: MINOR STEROID INJECTION;  Surgeon: Mat Carne, MD;  Location: [facility];  Service: Orthopedics;  Laterality: Left;  Epidural Steroid Injection Left Lumbar 4-5 and Lumbar 5-Sacral 1    History  Smoking status  . Current Every Day Smoker  Smokeless tobacco  . Not on file    History  Alcohol Use  . Yes    History   Social History  . Marital Status: Married    Spouse Name: N/A    Number of Children: N/A  . Years of Education: N/A   Occupational History  . Not on file.   Social History Main Topics  . Smoking status: Current Every Day Smoker  . Smokeless tobacco: Not on file  . Alcohol Use: Yes  . Drug Use: No  . Sexually Active:    Other Topics Concern  . Not on file   Social History Narrative  . No narrative on file    Allergies  Allergen Reactions  . Darvocet (Propoxyphene-Acetaminophen)   . Gabapentin Other (See Comments)    Makes me drunk    Current Facility-Administered Medications  Medication Dose Route Frequency Provider Last Rate Last Dose  . 0.9 %  sodium chloride infusion  250 mL Intravenous PRN Pamella Pert, MD      .  0.9 %  sodium chloride infusion   Intravenous Continuous Pamella Pert, MD      . 0.9 %  sodium chloride infusion  1 mL/kg/hr Intravenous Continuous Pamella Pert, MD      . 0.9 %  sodium chloride infusion  250 mL Intravenous Continuous Pamella Pert, MD      . acetaminophen (TYLENOL) tablet 650 mg  650 mg Oral Q4H PRN Pamella Pert, MD      . acetaminophen (TYLENOL) tablet 650 mg  650 mg Oral Q4H PRN Pamella Pert, MD      . ALPRAZolam Prudy Feeler) tablet 1 mg  1 mg Oral TID PRN Kerin Perna, MD      . aspirin chewable tablet 81 mg  81 mg Oral Daily Pamella Pert, MD      . benazepril (LOTENSIN) tablet 20 mg  20 mg Oral Daily Pamella Pert, MD      . carvedilol (COREG) tablet 6.25 mg  6.25 mg Oral BID WC Pamella Pert, MD      .  Chlorhexidine Gluconate Cloth 2 % PADS 6 each  6 each Topical Daily Pamella Pert, MD      . heparin 2-0.9 UNIT/ML-% infusion           . heparin ADULT infusion 100 units/mL (25000 units/250 mL)  900 Units/hr Intravenous Continuous Thuy Dien Dang, PHARMD      . HYDROmorphone (DILAUDID) 1 MG/ML injection           . HYDROmorphone (DILAUDID) 2 MG/ML injection           . HYDROmorphone (DILAUDID) injection 1 mg  1 mg Intravenous Once [employer], PA-C   1 mg at 01/06/12 1359  . HYDROmorphone (DILAUDID) injection 1 mg  1 mg Intravenous Q4H PRN Pamella Pert, MD   1 mg at 01/06/12 1630  . lidocaine (XYLOCAINE) 1 % injection           . midazolam (VERSED) 2 MG/2ML injection           . morphine 4 MG/ML injection 4 mg  4 mg Intravenous Once Glynn Octave, MD   4 mg at 01/06/12 1143  . mupirocin ointment (BACTROBAN) 2 % 1 application  1 application Nasal BID Pamella Pert, MD      . nitroGLYCERIN (NITROSTAT) SL tablet 0.4 mg  0.4 mg Sublingual Q5 Min x 3 PRN Pamella Pert, MD      . nitroGLYCERIN (NTG ON-CALL) 0.2 mg/mL injection           . nitroGLYCERIN 0.2 mg/mL in dextrose 5 % infusion  2-200 mcg/min Intravenous Once Glynn Octave, MD 9 mL/hr at 01/06/12 1233 30 mcg/min at 01/06/12 1233  . nitroGLYCERIN 0.2 mg/mL in dextrose 5 % infusion  2-200 mcg/min Intravenous Once Pamella Pert, MD 15 mL/hr at 01/06/12 1815 50 mcg/min at 01/06/12 1815  . omega-3 acid ethyl esters (LOVAZA) capsule 2 g  2 g Oral BID Pamella Pert, MD      . ondansetron [facility]) injection 4 mg  4 mg Intravenous Q6H PRN Pamella Pert, MD      . ondansetron [facility]) injection 4 mg  4 mg Intravenous Q6H PRN Pamella Pert, MD      . oxyCODONE-acetaminophen (PERCOCET/ROXICET) 5-325 MG per tablet 1-2 tablet  1-2 tablet Oral Q4H PRN Pamella Pert, MD   2 tablet at 01/06/12 1851  . simvastatin (ZOCOR) tablet 40 mg  40 mg Oral QPM Pamella Pert, MD      . sodium chloride 0.9 % injection 3 mL   3 mL Intravenous Q12H Pamella Pert, MD      . sodium chloride 0.9 % injection 3 mL  3 mL Intravenous PRN Pamella Pert, MD      . sodium chloride 0.9 % injection 3 mL  3 mL Intravenous Q12H Pamella Pert, MD      . sodium chloride 0.9 % injection 3 mL  3 mL Intravenous PRN Pamella Pert, MD      . zolpidem (AMBIEN) tablet 5 mg  5 mg Oral QHS PRN,MR X 1 Pamella Pert, MD      . DISCONTD: acetaminophen (TYLENOL) tablet 650 mg  650 mg Oral Q4H PRN Pamella Pert, MD      . DISCONTD: aspirin chewable tablet 324 mg  324 mg Oral Pre-Cath Pamella Pert, MD      . DISCONTD: aspirin chewable tablet 324 mg  324 mg Oral NOW Pamella Pert, MD      . DISCONTD: aspirin EC tablet 81 mg  81 mg Oral Daily Pamella Pert, MD      . DISCONTD: aspirin suppository 300 mg  300 mg Rectal NOW Pamella Pert, MD      . DISCONTD: aspirin tablet 325 mg  325 mg Oral Daily Pamella Pert, MD      . DISCONTD: ondansetron (ZOFRAN) injection 4 mg  4 mg Intravenous Q6H PRN Pamella Pert, MD      . DISCONTD: zolpidem (AMBIEN) tablet 5 mg  5 mg Oral QHS PRN Pamella Pert, MD         History reviewed. No pertinent family history. Positive family history of CAD, positive family history of diabetes  Review of Systems:     Cardiac Review of Systems: Y or N  Chest Pain [  Y.  ]  Resting SOB [  N. ] Exertional SOB  [Y.  ]  Orthopnea [ N. ]   Pedal Edema [ and  ]    Palpitations [n  ] Syncope  n  ]   Presyncope [ N.  ]  General Review of Systems: [Y] = yes [  ]=no Constitional: recent weight change [  ]; anorexia [  ]; fatigue [  ]; nausea [  ]; night sweats [  ]; fever [ N. ]; or chills [  ];                                                                                                                                          Dental: poor dentition[  ]; Last Dentist visit:> 1 yr  Eye : blurred vision [  ]; diplopia [   ]; vision changes [  ];  Amaurosis fugax[  ]; Resp: cough [   ];  wheezing[  ];  hemoptysis[  ]; shortness of breath[  ]; paroxysmal nocturnal dyspnea[  ]; dyspnea on exertion[  ]; or orthopnea[  ];  GI:  gallstones[  ], vomiting[  ];  dysphagia[  ]; melena[  ];  hematochezia [  ]; heartburn[Y.  ];   Hx of  Colonoscopy[  ]; GU: kidney stones [ Y. ]; hematuria[  ];   dysuria [  ];  nocturia[  ];  history of     obstruction [  ];                 Skin: rash, swelling[  ];, hair loss[  ];  peripheral edema[N.  ];  or itching[  ]; Musculosketetal: myalgias[  ];  joint swelling[  ];  joint erythema[  ];  joint pain[  ];  back pain[  ];  Heme/Lymph: bruising[  ];  bleeding[  ];  anemia[  ];  Neuro: TIA[  ];  headaches[  ];  stroke[ N. ];  vertigo[  ];  seizures[  ];   paresthesias[  ];  difficulty walking[  ];  Psych:depression[  ]; anxiety[Y.  ];  Endocrine: diabetes[  ];  thyroid dysfunction[  ];  Immunizations: Flu [ N. ]; Pneumococcal[  ];  Other:  Physical Exam: BP 122/69  Pulse 85  Temp 98.8 F (37.1 C) (Oral)  Resp 18  Ht 5\' 3"  (1.6 m)  Wt 163 lb 2.3 oz (74 kg)  BMI 28.90 kg/m2  SpO2 99%  Gen.-Middle-aged Caucasian male in the CCU accompanied by family anxious but in no acute distress HEENT normocephalic pupils equal dentition adequate Neck without JVD mass or bruit Lymphatics without palpable adenopathy Thorax well-healed sternal incision scattered rhonchi Cardiac regular rhythm without murmur or gallop Abdomen soft nontender without pulsatile mass Extremities left leg saphenous vein harvest scar, no edema or tenderness Vascular palpable pulses all extremities Neurologic right-hand dominant no focal deficit   Diagnostic Studies & Laboratory data:   Coronary tear grams reviewed with Dr. Newt Lukes severe three-vessel disease with occluded vein graft to the right coronary circulation. EF 40% inferolateral hypokinesia  Recent Radiology Findings:   Dg Chest Portable 1 View  01/06/2012  *RADIOLOGY REPORT*  Clinical Data: Chest pain  PORTABLE  CHEST - 1 VIEW  Comparison: 10/15/2009  Findings: Cardiomediastinal silhouette is stable.  Again noted status post CABG.  No acute infiltrate or pleural effusion.  No pulmonary edema.  Mild left basilar atelectasis.  IMPRESSION: Status post CABG.  Mild basilar atelectasis.  No focal infiltrate or pulmonary edema.   Original Report Authenticated By: Natasha Mead, M.D.       Recent Lab Findings: Lab Results  Component Value Date   WBC 15.0* 01/06/2012   HGB 16.2 01/06/2012   HCT 45.6 01/06/2012   PLT 311 01/06/2012   GLUCOSE 141* 01/06/2012   CHOL 228* 01/06/2012   TRIG 521* 01/06/2012   HDL 29* 01/06/2012   LDLCALC UNABLE TO CALCULATE IF TRIGLYCERIDE OVER 400 mg/dL 1/61/0960   ALT 25 4/54/0981   AST 25 01/06/2012   NA 138 01/06/2012   K 4.1 01/06/2012   CL 100 01/06/2012   CREATININE 0.81 01/06/2012   BUN 10 01/06/2012   CO2 23 01/06/2012   INR 0.88 01/06/2012      Assessment / Plan:      We need the op note from [facility]. Preoperative carotid Doppler study, echo, and PFTs will be needed. We'll plan on keeping patient on IV heparin until  date of surgery. We'll plan probable endoscopic harvest of right leg vein, left radial arteries, and left IMA harvest for conduit.  Procedure discussed with patient and family and he agrees to proceed.

## 2012-01-06 NOTE — ED Provider Notes (Signed)
History     CSN: [id number]  Arrival date & time 01/06/12  4098   First MD Initiated Contact with Patient 01/06/12 [phone]      Chief Complaint  Patient presents with  . Chest Pain    (Consider location/radiation/quality/duration/timing/severity/associated sxs/prior treatment) HPI Comments: Patient presents with left-sided chest pain has been intermittent over the past 2 days. It started after he lifted his dog. Patient however has a history of MI status post CABG 2010 in [place]. He states he is not have a cardiologist. The pain comes and goes lasting minutes to hours at a time. Associated with shortness of breath nausea. He denies having a workup after his bypass. Denies any back pain, abdominal pain, vomiting. Took nitroglycerin from EMS with partial relief.  The history is provided by the patient and the EMS personnel.    Past Medical History  Diagnosis Date  . Back pain, chronic   . Coronary artery disease   . MI (myocardial infarction) 2007    has had two  . Herniated disc   . Renal disorder     kidney stones  . Hx of CABG     Past Surgical History  Procedure Date  . Coronary artery bypass graft   . Steriod injection 09/02/2011    Procedure: MINOR STEROID INJECTION;  Surgeon: Mat Carne, MD;  Location: [facility];  Service: Orthopedics;  Laterality: Left;  Epidural Steroid Injection Left Lumbar 4-5 and Lumbar 5-Sacral 1    History reviewed. No pertinent family history.  History  Substance Use Topics  . Smoking status: Current Every Day Smoker  . Smokeless tobacco: Not on file  . Alcohol Use: Yes      Review of Systems  Cardiovascular: Positive for chest pain.    Allergies  Darvocet and Gabapentin  Home Medications   No current outpatient prescriptions on file.  BP 125/80  Pulse 92  Temp 98.8 F (37.1 C) (Oral)  Resp 21  Ht 5\' 3"  (1.6 m)  Wt 175 lb (79.379 kg)  BMI 31.00 kg/m2  SpO2 99%  Physical Exam    Constitutional: He is oriented to person, place, and time. He appears well-developed and well-nourished. No distress.       uncomfortable  HENT:  Head: Normocephalic and atraumatic.  Mouth/Throat: Oropharynx is clear and moist. No oropharyngeal exudate.  Eyes: Conjunctivae normal and EOM are normal. Pupils are equal, round, and reactive to light.  Neck: Normal range of motion. Neck supple.  Cardiovascular: Normal rate, regular rhythm and normal heart sounds.   Pulmonary/Chest: Effort normal and breath sounds normal. No respiratory distress.  Abdominal: Soft. There is no tenderness. There is no rebound and no guarding.  Musculoskeletal: Normal range of motion. He exhibits no edema and no tenderness.  Neurological: He is alert and oriented to person, place, and time. No cranial nerve deficit.  Skin: Skin is warm.    ED Course  Procedures (including critical care time)  Labs Reviewed  CBC WITH DIFFERENTIAL - Abnormal; Notable for the following:    WBC 15.0 (*)     Neutro Abs 11.5 (*)     Monocytes Absolute 1.1 (*)     All other components within normal limits  COMPREHENSIVE METABOLIC PANEL - Abnormal; Notable for the following:    Glucose, Bld 141 (*)     All other components within normal limits  URINALYSIS, ROUTINE W REFLEX MICROSCOPIC - Abnormal; Notable for the following:    Hgb urine dipstick TRACE (*)  All other components within normal limits  TROPONIN I  URINE RAPID DRUG SCREEN (HOSP PERFORMED)  PROTIME-INR  URINE MICROSCOPIC-ADD ON  SURGICAL PCR SCREEN   Dg Chest Portable 1 View  01/06/2012  *RADIOLOGY REPORT*  Clinical Data: Chest pain  PORTABLE CHEST - 1 VIEW  Comparison: 10/15/2009  Findings: Cardiomediastinal silhouette is stable.  Again noted status post CABG.  No acute infiltrate or pleural effusion.  No pulmonary edema.  Mild left basilar atelectasis.  IMPRESSION: Status post CABG.  Mild basilar atelectasis.  No focal infiltrate or pulmonary edema.   Original  Report Authenticated By: Natasha Mead, M.D.      1. Coronary atherosclerosis of native coronary artery       MDM  Chest pain with history of CABG, concerning for unstable angina. EKG nonischemic but new T wave inversions laterally. Patient given aspirin, nitroglycerin, we'll obtain labs and x-ray and discussed with cardiology.  Symptoms concerning for unstable angina, aspirin, nitroglycerin, heparin given. New T wave changes laterally.  Troponin negative. Case discussed with Dr. Jacinto Halim who will admit the patient and likely proceed with catheterization.     Date: 01/06/2012  Rate: 59  Rhythm: normal sinus rhythm  QRS Axis: normal  Intervals: normal  ST/T Wave abnormalities: nonspecific ST/T changes  Conduction Disutrbances:none  Narrative Interpretation: T wave inversions laterally  Old EKG Reviewed: changes noted  CRITICAL CARE Performed by: Glynn Octave   Total critical care time: 30  Critical care time was exclusive of separately billable procedures and treating other patients.  Critical care was necessary to treat or prevent imminent or life-threatening deterioration.  Critical care was time spent personally by me on the following activities: development of treatment plan with patient and/or surrogate as well as nursing, discussions with consultants, evaluation of patient's response to treatment, examination of patient, obtaining history from patient or surrogate, ordering and performing treatments and interventions, ordering and review of laboratory studies, ordering and review of radiographic studies, pulse oximetry and re-evaluation of patient's condition.    Glynn Octave, MD 01/06/12 [phone]

## 2012-01-06 NOTE — ED Notes (Signed)
Pt states the pain never eased completely off but it went up from a 2 to a 6/10.

## 2012-01-06 NOTE — ED Notes (Signed)
Dr. Rancour at the bedside.  

## 2012-01-06 NOTE — ED Notes (Signed)
Portable chest xray being completed.  

## 2012-01-06 NOTE — H&P (Signed)
Jonathan Mason is an 36 y.o. male.   Chief Complaint: Chest pain that started yesterday HPI: Patient is a 35 year old Caucasian male with history of hyperlipidemia, history of myocardial infarction x2 one in 1996 and the second episode in 1998 leading to CABG at [facility] who has relocated to [place] presents to the emergency department complaining of chest pain. He states that last night he tried to reach her for an object and was holding the dog when he suddenly felt chest discomfort in a middle of the chest and left upper part of the chest. Since then he has been having on and off chest discomfort which reminds him of his prior myocardial infarction. He describes his discomfort as pressure-like sensation in the middle of the chest. There is no associated radiation. It is associated with mild diaphoresis and dyspnea. He still continues to have chest discomfort in the emergency department. I have been consulted to see the patient. He also has mild abdominal discomfort that is chronic, chronic back pain, chronic urolithiasis and neck pain from degenerative neck disc disease. He denies any leg edema, hemoptysis, syncope. His wife is at the bedside.  Past Medical History  Diagnosis Date  . Back pain, chronic   . Coronary artery disease   . MI (myocardial infarction) 2007    has had two  . Herniated disc   . Renal disorder     kidney stones  . Hx of CABG     Past Surgical History  Procedure Date  . Coronary artery bypass graft   . Steriod injection 09/02/2011    Procedure: MINOR STEROID INJECTION;  Surgeon: Mat Carne, MD;  Location: [facility];  Service: Orthopedics;  Laterality: Left;  Epidural Steroid Injection Left Lumbar 4-5 and Lumbar 5-Sacral 1    History reviewed. No pertinent family history. Social History:  reports that he has been smoking.  He does not have any smokeless tobacco history on file. He reports that he drinks alcohol. He reports that he does  not use illicit drugs.  Allergies:  Allergies  Allergen Reactions  . Darvocet (Propoxyphene-Acetaminophen)   . Gabapentin Other (See Comments)    Makes me drunk     (Not in a hospital admission)  Results for orders placed during the hospital encounter of 01/06/12 (from the past 48 hour(s))  CBC WITH DIFFERENTIAL     Status: Abnormal   Collection Time   01/06/12 10:02 AM      Component Value Range Comment   WBC 15.0 (*) 4.0 - 10.5 K/uL    RBC 5.03  4.22 - 5.81 MIL/uL    Hemoglobin 16.2  13.0 - 17.0 g/dL    HCT 47.8  29.5 - 62.1 %    MCV 90.7  78.0 - 100.0 fL    MCH 32.2  26.0 - 34.0 pg    MCHC 35.5  30.0 - 36.0 g/dL    RDW 30.8  65.7 - 84.6 %    Platelets 311  150 - 400 K/uL    Neutrophils Relative 76  43 - 77 %    Neutro Abs 11.5 (*) 1.7 - 7.7 K/uL    Lymphocytes Relative 15  12 - 46 %    Lymphs Abs 2.2  0.7 - 4.0 K/uL    Monocytes Relative 8  3 - 12 %    Monocytes Absolute 1.1 (*) 0.1 - 1.0 K/uL    Eosinophils Relative 1  0 - 5 %    Eosinophils Absolute  0.2  0.0 - 0.7 K/uL    Basophils Relative 0  0 - 1 %    Basophils Absolute 0.0  0.0 - 0.1 K/uL   COMPREHENSIVE METABOLIC PANEL     Status: Abnormal   Collection Time   01/06/12 10:02 AM      Component Value Range Comment   Sodium 138  135 - 145 mEq/L    Potassium 4.1  3.5 - 5.1 mEq/L    Chloride 100  96 - 112 mEq/L    CO2 23  19 - 32 mEq/L    Glucose, Bld 141 (*) 70 - 99 mg/dL    BUN 10  6 - 23 mg/dL    Creatinine, Ser 1.61  0.50 - 1.35 mg/dL    Calcium 09.6  8.4 - 10.5 mg/dL    Total Protein 7.1  6.0 - 8.3 g/dL    Albumin 3.9  3.5 - 5.2 g/dL    AST 25  0 - 37 U/L    ALT 25  0 - 53 U/L    Alkaline Phosphatase 84  39 - 117 U/L    Total Bilirubin 0.3  0.3 - 1.2 mg/dL    GFR calc non Af Amer >90  >90 mL/min    GFR calc Af Amer >90  >90 mL/min   PROTIME-INR     Status: Normal   Collection Time   01/06/12 10:02 AM      Component Value Range Comment   Prothrombin Time 12.1  11.6 - 15.2 seconds    INR 0.88  0.00 -  1.49   TROPONIN I     Status: Normal   Collection Time   01/06/12 10:03 AM      Component Value Range Comment   Troponin I <0.30  <0.30 ng/mL   URINALYSIS, ROUTINE W REFLEX MICROSCOPIC     Status: Abnormal   Collection Time   01/06/12 12:32 PM      Component Value Range Comment   Color, Urine YELLOW  YELLOW    APPearance CLEAR  CLEAR    Specific Gravity, Urine 1.012  1.005 - 1.030    pH 7.0  5.0 - 8.0    Glucose, UA NEGATIVE  NEGATIVE mg/dL    Hgb urine dipstick TRACE (*) NEGATIVE    Bilirubin Urine NEGATIVE  NEGATIVE    Ketones, ur NEGATIVE  NEGATIVE mg/dL    Protein, ur NEGATIVE  NEGATIVE mg/dL    Urobilinogen, UA 0.2  0.0 - 1.0 mg/dL    Nitrite NEGATIVE  NEGATIVE    Leukocytes, UA NEGATIVE  NEGATIVE   URINE RAPID DRUG SCREEN (HOSP PERFORMED)     Status: Normal   Collection Time   01/06/12 12:32 PM      Component Value Range Comment   Opiates NONE DETECTED  NONE DETECTED    Cocaine NONE DETECTED  NONE DETECTED    Benzodiazepines NONE DETECTED  NONE DETECTED    Amphetamines NONE DETECTED  NONE DETECTED    Tetrahydrocannabinol NONE DETECTED  NONE DETECTED    Barbiturates NONE DETECTED  NONE DETECTED   URINE MICROSCOPIC-ADD ON     Status: Normal   Collection Time   01/06/12 12:32 PM      Component Value Range Comment   Squamous Epithelial / LPF RARE  RARE    WBC, UA 0-2  <3 WBC/hpf    RBC / HPF 3-6  <3 RBC/hpf    Bacteria, UA RARE  RARE    Dg Chest Portable 1 View  01/06/2012  *RADIOLOGY REPORT*  Clinical Data: Chest pain  PORTABLE CHEST - 1 VIEW  Comparison: 10/15/2009  Findings: Cardiomediastinal silhouette is stable.  Again noted status post CABG.  No acute infiltrate or pleural effusion.  No pulmonary edema.  Mild left basilar atelectasis.  IMPRESSION: Status post CABG.  Mild basilar atelectasis.  No focal infiltrate or pulmonary edema.   Original Report Authenticated By: Natasha Mead, M.D.     ROS: Patient has history of urolithiasis. He has occasionally noticed cloudy  urine. He has chronic back pain. No history of fever, cold or cough. No hemoptysis. Does have mild dyspnea. There is no recent weight change. No heat.cold intolerance. Is not a diabetic.  Blood pressure 125/80, pulse 83, temperature 99 F (37.2 C), temperature source Oral, resp. rate 21, height 5\' 3"  (1.6 m), weight 79.379 kg (175 lb), SpO2 99.00%. General appearance: alert, cooperative, appears older than stated age, no distress and Keeps shaking his legs and states it is due to chest pain. Eyes: conjunctivae/corneas clear. PERRL, EOM's intact. Fundi benign. Neck: no adenopathy, no carotid bruit, no JVD, supple, symmetrical, trachea midline and thyroid not enlarged, symmetric, no tenderness/mass/nodules Neck: JVP - normal, carotids 2+= without bruits Resp: clear to auscultation bilaterally Chest wall: no tenderness Cardio: regular rate and rhythm, S1, S2 normal, no murmur, click, rub or gallop GI: soft, non-tender; bowel sounds normal; no masses,  no organomegaly Extremities: extremities normal, atraumatic, no cyanosis or edema Pulses: 2+ and symmetric Skin: Skin color, texture, turgor normal. No rashes or lesions Neurologic: Alert and oriented X 3, normal strength and tone. Normal symmetric reflexes. Normal coordination and gait  EKG: 12/29/2011 reveals sinus bradycardia rate of 59 beats a minute, normal intervals. Poor R-wave progression. Nonspecific T-wave changes.  Assessment/Plan 1. Chest pain suggest of unstable angina pectoris. Chest pain similar to his angina prior to his CABG. 2. Hypertension 3. Hyperlipidemia 4. Tobacco use disorder 5. Urolithiasis and presence of hematuria on urinalysis. Patient has chronic urinary stones. 6. CAD, S/P CABG (2 vessels) done at [facility] in 2008. History of myocardial infarction in 2006 and 2008.  Recommendation: I discussed his presentation with the patient and his wife and the consent for unstable angina pectoris. Although he has continuous chest  pain, cardiac markers are negative myocardial injury. I'm not completely convinced whether he has acute coronary syndrome, however he does have significant contrast the risks. I discussed with them regarding proceeding with cardiac catheterization as he continues to have chest pain even at rest in spite of being treated with pain medications and heparin. They understand the risk of that, stroke, MI, urgent CABG, bleeding, infection but not limited to these as a major competition from cardiac catheterization and want to proceed with cardiac catheterization. I discussed with them regarding smoking cessation. He states that he will completely do on smoking from now on. I will continue to reinforce this. At this point have not made any changes to his medical therapy. Further recommendations after cardiac catheterization.  Pamella Pert, MD 01/06/2012, 2:44 PM

## 2012-01-06 NOTE — ED Notes (Signed)
Per [facility], pt had picked up his 70 lb dog 2 days ago and has been having left sided cp since then.  Pt was given 324 mg ASA and 1 NTG in route by EMS with relief. Placed on O2 at 2 liters. Pt now c/o the pain is coming back. 18g to [facility] NSL. NSR on 12 lead with no ectopy per EMS. VSS 140/97, 57 HR, 18 RR, 100 O2

## 2012-01-06 NOTE — Interval H&P Note (Signed)
History and Physical Interval Note:  01/06/2012 3:11 PM  Jonathan Mason  has presented today for surgery, with the diagnosis of cp  The various methods of treatment have been discussed with the patient and family. After consideration of risks, benefits and other options for treatment, the patient has consented to  Procedure(s) (LRB) with comments: LEFT HEART CATHETERIZATION WITH CORONARY/GRAFT ANGIOGRAM () and possible angioplasty as a surgical intervention .  The patient's history has been reviewed, patient examined, no change in status, stable for surgery.  I have reviewed the patient's chart and labs.  Questions were answered to the patient's satisfaction.     Pamella Pert

## 2012-01-06 NOTE — Progress Notes (Signed)
ANTICOAGULATION CONSULT NOTE - Initial Consult  Pharmacy Consult:  Heparin Indication: ACS  Allergies  Allergen Reactions  . Darvocet (Propoxyphene-Acetaminophen)   . Gabapentin Other (See Comments)    Makes me drunk    Patient Measurements: Height: 5\' 3"  (160 cm) Weight: 175 lb (79.379 kg) IBW/kg (Calculated) : 56.9  Heparin Dosing Weight: 74 kg  Vital Signs: Temp: 98.8 F (37.1 C) (09/12 1620) Temp src: Oral (09/12 1620) BP: 125/80 mmHg (09/12 1321) Pulse Rate: 92  (09/12 1500)  Labs:  Basename 01/06/12 1003 01/06/12 1002  HGB -- 16.2  HCT -- 45.6  PLT -- 311  APTT -- --  LABPROT -- 12.1  INR -- 0.88  HEPARINUNFRC -- --  CREATININE -- 0.81  CKTOTAL -- --  CKMB -- --  TROPONINI <0.30 --    Estimated Creatinine Clearance: 116.4 ml/min (by C-G formula based on Cr of 0.81).   Medical History: Past Medical History  Diagnosis Date  . Back pain, chronic   . Coronary artery disease   . MI (myocardial infarction) 2007    has had two  . Herniated disc   . Renal disorder     kidney stones  . Hx of CABG         Assessment: 75 YOM with PMH significant for HTN, HLD, tobacco use, and CAD s/p CABG x 2 in 2008.  Patient presented today 01/06/12 with chest pain and was taken to the cath lab.  Per MD note, patient will need inpatient CABG and to start IV heparin until then.  Patient's sheath removed ~1600 today and no hematoma/bleeding at site per RN.   Goal of Therapy:  Heparin level 0.3-0.7 units/ml Monitor platelets by anticoagulation protocol: Yes    Plan:  - At 2000 (4 hrs post sheath removal), start heparin gtt at 900 units/hr, no bolus s/p cath  - Check 6 hr HL - Daily HL / CBC     Loreli Debruler D. Laney Potash, PharmD, BCPS Pager:  [phone] 01/06/2012, 4:51 PM

## 2012-01-06 NOTE — ED Notes (Signed)
Pt given a urinal and is aware of need for urine sample.

## 2012-01-06 NOTE — CV Procedure (Signed)
Procedure performed:  Left heart catheterization including hemodynamic monitoring of the left ventricle, selective right and left coronary arteriography. Selective left and right subclavian arteriogram and left internal mammary arteriogram right femoral arteriogram and closure of the right femoral arterial access with Perclose.   Indication patient is a 36 year-old man with history of control hypertension, hyperlipidemia, who presents with chest pain and symptoms suggestive of unstable angina pectoris. He is known coronary artery disease and has CABG in 2008 at [facility] [place]. Patient now brought to the catheterization lab  to evaluate his coronary anatomy.  Hemodynamic data:  Left ventricular pressure was 118/5with LVEDP of 12 mm mercury. Aortic pressure was 112/84 with a mean of 97 mm mercury.There was no pressure gradient across the aortic valve.  Left ventricle: Performed in the RAO and LAO projection revealed LVEF of 40%. Mild diffuse hypokinesis and lateral wall akinesis.   Right coronary artery:  dominant vessel, occluded in the proximal segment. Distally supplied by saphenous vein graft. Contralateral collaterals from the left system to the distal right coronary artery is visualized. The PDA appears to be very large.   SVG to RCA: Is ectatic, severely diffusely diseased and appears to be occluded in the distal segment. This appears to be the culprit and probably subacute occlusion. Distal occlusion appears to be old thrombus.  Left main coronary artery is large and normal.   Circumflex coronary artery: A large vessel giving origin to a large obtuse marginal 1. The circumflex coronary artery is occluded in the proximal segment. The OM-1 is very large and fills retrogradely through left left collaterals. Faint collaterals to the right coronary artery distal bed is also evident.  LAD:  LAD gives origin to a large diagonal 1. LAD has proximal and midsegment long 80% stenosis. D1 in  the mid segment has 80-90% stenosis. Is a large vessel.  Ramus intermediate: This is a proximal 70% stenosis.  Left subdural artery and LIMA, right subdural artery and RIMA widely patent.  Technique: Under sterile precautions using a 6 French right femoral arterial access, a 6 French sheath was introduced into the right femoral artery under fluoroscopy guidance. A 6 [place] multipurpose B2 catheter was advanced into the ascending aorta and then into the left ventricle. Hemodynamics are analysed. LV angiogram   performed in LAO and RAO  projection. Catheter pulled into the ascending aorta and right coronary artery was cannulated and angiography was performed in multiple views.The catheter was pulled body and the 6 injections left 4 diagnostic catheter was utilized to engage the left then coronary artery. The catheter actually engage the left subclavian artery which are utilized to engage the left internal mammary artery and then the advanced back into the ACE inhibitor to engage the left coronary system. I pulled this out of the body and a 5 [place] Judkins right diagnostic catheter was utilized to engage the saphenous vein graft to right coronary artery and probably this was a jump graft to OM1 and PDA. Intracoronary nitroglycerin was administered. Angiogram was performed. Then the right subdural artery was selected cannulated and angiogram was performed.  Catheter exchanged out of the body over J-Wire. NO immediate complications noted.  Right femoral arteriogram was performed through the arterial access site and access was closed with Perclose with excellent hemostasis.  Patient tolerated the procedure well.   Disposition:  Patient will need inpatient CABG do to critical multivessel coronary artery disease. Patient has ongoing symptoms of chest pain. Surgical consultation has origin placed. He'll need continued  risk factor modification.

## 2012-01-06 NOTE — ED Notes (Signed)
Urine sample discussed/patient doesn't appear to be interested in providing one.

## 2012-01-07 ENCOUNTER — Inpatient Hospital Stay (HOSPITAL_COMMUNITY): Payer: Medicaid Other

## 2012-01-07 DIAGNOSIS — Z01810 Encounter for preprocedural cardiovascular examination: Secondary | ICD-10-CM

## 2012-01-07 LAB — TSH: TSH: 1.753 u[IU]/mL (ref 0.350–4.500)

## 2012-01-07 LAB — BASIC METABOLIC PANEL
BUN: 10 mg/dL (ref 6–23)
CO2: 27 mEq/L (ref 19–32)
Calcium: 9.4 mg/dL (ref 8.4–10.5)
Chloride: 102 mEq/L (ref 96–112)
Creatinine, Ser: 0.77 mg/dL (ref 0.50–1.35)
GFR calc Af Amer: >90 mL/min (ref >90)
GFR calc non Af Amer: >90 mL/min (ref >90)
Glucose, Bld: 93 mg/dL (ref 70–99)
Potassium: 4 mEq/L (ref 3.5–5.1)
Sodium: 140 mEq/L (ref 135–145)

## 2012-01-07 LAB — CBC
MCV: 90.2 fL (ref 78.0–100.0)
Platelets: 234 10*3/uL (ref 150–400)
RBC: 4.78 MIL/uL (ref 4.22–5.81)
RDW: 11.9 % (ref 11.5–15.5)
WBC: 13.2 10*3/uL — ABNORMAL HIGH (ref 4.0–10.5)

## 2012-01-07 LAB — HEPARIN LEVEL (UNFRACTIONATED)
Heparin Unfractionated: <0.1 IU/mL — ABNORMAL LOW (ref 0.30–0.70)
Heparin Unfractionated: <0.1 IU/mL — ABNORMAL LOW (ref 0.30–0.70)
Heparin Unfractionated: 0.24 IU/mL — ABNORMAL LOW (ref 0.30–0.70)

## 2012-01-07 LAB — HEMOGLOBIN A1C
Hgb A1c MFr Bld: 5.8 % — ABNORMAL HIGH (ref <5.7)
Mean Plasma Glucose: 120 mg/dL — ABNORMAL HIGH (ref <117)

## 2012-01-07 LAB — D-DIMER, QUANTITATIVE: D-Dimer, Quant: <0.27 ug/mL-FEU (ref 0.00–0.48)

## 2012-01-07 MED ORDER — METOPROLOL TARTRATE 25 MG PO TABS
25.0000 mg | ORAL_TABLET | Freq: Once | ORAL | Status: AC
  Administered 2012-01-10: 25 mg via ORAL
  Filled 2012-01-07 (×2): qty 1

## 2012-01-07 MED ORDER — ATORVASTATIN CALCIUM 80 MG PO TABS
80.0000 mg | ORAL_TABLET | Freq: Every day | ORAL | Status: DC
  Administered 2012-01-07 – 2012-01-14 (×7): 80 mg via ORAL
  Filled 2012-01-07 (×9): qty 1

## 2012-01-07 MED ORDER — HEPARIN (PORCINE) IN NACL 100-0.45 UNIT/ML-% IJ SOLN
2000.0000 [IU]/h | INTRAMUSCULAR | Status: DC
  Administered 2012-01-08 – 2012-01-10 (×2): 2000 [IU]/h via INTRAVENOUS
  Filled 2012-01-07 (×4): qty 250

## 2012-01-07 MED ORDER — PANTOPRAZOLE SODIUM 40 MG PO TBEC
40.0000 mg | DELAYED_RELEASE_TABLET | Freq: Every day | ORAL | Status: DC
  Administered 2012-01-07 – 2012-01-09 (×3): 40 mg via ORAL
  Filled 2012-01-07 (×3): qty 1

## 2012-01-07 NOTE — Progress Notes (Signed)
ANTICOAGULATION CONSULT NOTE - Follow Up Consult  Pharmacy Consult for Heparin Indication: chest pain/ACS  Allergies  Allergen Reactions  . Darvocet (Propoxyphene-Acetaminophen)   . Gabapentin Other (See Comments)    Makes me drunk    Patient Measurements: Height: 5\' 3"  (160 cm) Weight: 163 lb 2.3 oz (74 kg) IBW/kg (Calculated) : 56.9  Heparin Dosing Weight:   Vital Signs: Temp: 97.4 F (36.3 C) (09/13 1200) Temp src: Oral (09/13 1200) BP: 108/66 mmHg (09/13 2100)  Labs:  Basename 01/07/12 2037 01/07/12 1300 01/07/12 0520 01/07/12 0510 01/07/12 0018 01/06/12 1836 01/06/12 1002  HGB -- -- 15.0 -- -- -- 16.2  HCT -- -- 43.1 -- -- -- 45.6  PLT -- -- 234 -- -- -- 311  APTT -- -- -- -- -- -- --  LABPROT -- -- -- -- -- -- 12.1  INR -- -- -- -- -- -- 0.88  HEPARINUNFRC 0.24* <0.10* <0.10* -- -- -- --  CREATININE -- -- 0.77 -- -- -- 0.81  CKTOTAL -- -- -- -- -- 362* --  CKMB -- -- -- -- -- 46.4* --  TROPONINI -- -- -- 6.56* 7.50* 2.93* --    Estimated Creatinine Clearance: 113.9 ml/min (by C-G formula based on Cr of 0.77).  Assessment: 36yo male with chest pain,, for re-do CABG on Monday.  Heparin level is improved, but remains sub-therapeutic at 0.24 on 1450 units/hr.  No issues with the IV, infusion per discussion with RN.    Goal of Therapy:  Heparin level 0.3-0.7 units/ml Monitor platelets by anticoagulation protocol: Yes   Plan:  1.  Increase heparin to 1600 units/hr 2.  Check heparin level 6hr  Marisue Humble, PharmD Clinical Pharmacist [facility]

## 2012-01-07 NOTE — Progress Notes (Signed)
Attempted ambulation twice today but has been having other procedures done. Will f/u tomorrow. Ethelda Chick CES, ACSM

## 2012-01-07 NOTE — Progress Notes (Signed)
ANTICOAGULATION CONSULT NOTE - Follow Up Consult  Pharmacy Consult for heparin Indication: CAD awaiting possible CABG  Labs:  Basename 01/07/12 0520 01/07/12 0510 01/07/12 0018 01/06/12 1836 01/06/12 1002  HGB 15.0 -- -- -- 16.2  HCT 43.1 -- -- -- 45.6  PLT 234 -- -- -- 311  APTT -- -- -- -- --  LABPROT -- -- -- -- 12.1  INR -- -- -- -- 0.88  HEPARINUNFRC <0.10* -- -- -- --  CREATININE 0.77 -- -- -- 0.81  CKTOTAL -- -- -- 362* --  CKMB -- -- -- 46.4* --  TROPONINI -- 6.56* 7.50* 2.93* --    Assessment: 36yo male undetectable on heparin with initial dosing for CAD awaiting possible CABG.  Goal of Therapy:  Heparin level 0.3-0.7 units/ml   Plan:  Will increase heparin gtt by 4 units/kg/hr to 1200 units/hr (will hold off on bolus for now due to cath <24hr ago) and check level in 6hr.  Colleen Can PharmD BCPS 01/07/2012,6:57 AM

## 2012-01-07 NOTE — Progress Notes (Signed)
  Echocardiogram 2D Echocardiogram has been performed.  Jonathan Mason 01/07/2012, 11:48 AM

## 2012-01-07 NOTE — Progress Notes (Signed)
CRITICAL VALUE ALERT  Critical value received:  Trop 2.93, CKMB 46.4  Date of notification:  01/06/12  Time of notification:  1936  Critical value read back:yes  Nurse who received alert:  Rocco Pauls RN  MD notified (1st page):  Dr Jacinto Halim  Time of first page:  1939  MD notified (2nd page):  Time of second page:  Responding MD:  Dr Jacinto Halim  Time MD responded: [phone]

## 2012-01-07 NOTE — Progress Notes (Addendum)
VASCULAR LAB PRELIMINARY  PRELIMINARY  PRELIMINARY  PRELIMINARY  Pre-op Cardiac Surgery  Carotid Findings:  Bilateral:  No evidence of hemodynamically significant internal carotid artery stenosis.   Vertebral artery flow is antegrade.      Upper Extremity Right Left  Brachial Pressures 103 Triphasic 114 Triphasic  Radial Waveforms Triphasic Triphasic  Ulnar Waveforms Triphasic Triphasic  Palmar Arch (Allen's Test) Abnormal Abnormal   Findings:  Right Doppler waveforms obliterate with radial compression and remain normal with ulnar compression. Left Doppler waveforms remain normal with radial compression and reverse with ulnar compression.    Lower  Extremity Right Left  Dorsalis Pedis    Anterior Tibial    Posterior Tibial    Ankle/Brachial Indices      Findings:  Palpable pulses bilaterally at rest   CESTONE, HELENE, RVT 01/07/2012, 12:16 PM  [place], [place], [place] 9/13/30133:41 PM

## 2012-01-07 NOTE — Progress Notes (Signed)
ANTICOAGULATION CONSULT NOTE - Follow Up Consult  Pharmacy Consult for heparin Indication: CAD awaiting possible CABG  Labs:  Basename 01/07/12 1300 01/07/12 0520 01/07/12 0510 01/07/12 0018 01/06/12 1836 01/06/12 1002  HGB -- 15.0 -- -- -- 16.2  HCT -- 43.1 -- -- -- 45.6  PLT -- 234 -- -- -- 311  APTT -- -- -- -- -- --  LABPROT -- -- -- -- -- 12.1  INR -- -- -- -- -- 0.88  HEPARINUNFRC <0.10* <0.10* -- -- -- --  CREATININE -- 0.77 -- -- -- 0.81  CKTOTAL -- -- -- -- 362* --  CKMB -- -- -- -- 46.4* --  TROPONINI -- -- 6.56* 7.50* 2.93* --    Assessment: 36yo male on IV heparin for CAD awaiting possible CABG. Heparin level remains undetectable after rate increase this morning. Hgb/Plts are stable, no bleeding noted per chart.  Goal of Therapy:  Heparin level 0.3-0.7 units/ml   Plan:  - Will increase heparin rate to 1450 units/hr (will hold off on bolus for now due to cath <24hr ago) - F/u heparin level in 6 hrs at 2030  Bayard Hugger, PharmD, BCPS  Clinical Pharmacist  Pager: [phone]  01/07/2012,2:26 PM

## 2012-01-07 NOTE — Care Management Note (Unsigned)
    Page 1 of 1   01/11/2012     4:01:56 PM   CARE MANAGEMENT NOTE 01/11/2012  Patient:  Jonathan Mason, Jonathan Mason   Account Number:  [id number]  Date Initiated:  01/07/2012  Documentation initiated by:  Junius Creamer  Subjective/Objective Assessment:   adm w ch pain, for cabg     Action/Plan:   lives w wife, pcp dr Casimiro Needle badger   Anticipated DC Date:  01/15/2012   Anticipated DC Plan:  HOME W HOME HEALTH SERVICES      DC Planning Services  CM consult      Choice offered to / List presented to:             Status of service:  In process, will continue to follow Medicare Important Message given?   (If response is "NO", the following Medicare IM given date fields will be blank) Date Medicare IM given:   Date Additional Medicare IM given:    Discharge Disposition:    Per UR Regulation:  Reviewed for med. necessity/level of care/duration of stay  If discussed at Long Length of Stay Meetings, dates discussed:    Comments:  01/11/12 Maison Kestenbaum,RN,BSN 1330 PT S/P CABG X4 ON 01/10/12.  PTA, PT INDEPENDENT, LIVES WITH WIFE.  MET WITH PT AND MOTHER TO DISCUSS DC PLANS.  MOTHER STATES WIFE WORKS, BUT THEY WILL COME UP WITH A PLAN TO PROVIDE 24H CARE AT DISCHARGE.  WILL FOLLOW FOR DC PLANS.  9/13 9:35a debbie dowell rn,bsn [phone]

## 2012-01-07 NOTE — Progress Notes (Signed)
Subjective:  Patient is currently feeling well.  He has not had any further chest discomfort.  Complains of headache due to being on nitroglycerin.  Otherwise no other complaints.  Objective:  Vital Signs in the last 24 hours: Temp:  [97.4 F (36.3 C)-98.1 F (36.7 C)] 97.4 F (36.3 C) (09/13 1200) Pulse Rate:  [58-112] 112  (09/12 2300) Resp:  [12-25] 24  (09/13 1200) BP: (85-130)/(53-89) 93/61 mmHg (09/13 1200) SpO2:  [93 %-99 %] 94 % (09/13 1200)  Intake/Output from previous day: 09/12 0701 - 09/13 0700 In: 1022.6 [I.V.:1022.6] Out: 1500 [Urine:1500]  Physical Exam:  General appearance: alert, cooperative, appears older than stated age, no distress and Keeps shaking his legs and states it is due to chest pain.  Eyes: conjunctivae/corneas clear. PERRL, EOM's intact. Fundi benign.  Neck: no adenopathy, no carotid bruit, no JVD, supple, symmetrical, trachea midline and thyroid not enlarged, symmetric, no tenderness/mass/nodules  Neck: JVP - normal, carotids 2+= without bruits  Resp: clear to auscultation bilaterally  Chest wall: no tenderness  Cardio: regular rate and rhythm, S1, S2 normal, no murmur, click, rub or gallop  GI: soft, non-tender; bowel sounds normal; no masses, no organomegaly  Extremities: extremities normal, atraumatic, no cyanosis or edema  Pulses: 2+ and symmetric  Skin: Skin color, texture, turgor normal. No rashes or lesions  Neurologic: Alert and oriented X 3.  Right groin site without hematoma.  Lab Results:  Basename 01/07/12 0520 01/06/12 1002  WBC 13.2* 15.0*  HGB 15.0 16.2  PLT 234 311    Basename 01/07/12 0520 01/06/12 1002  NA 140 138  K 4.0 4.1  CL 102 100  CO2 27 23  GLUCOSE 93 141*  BUN 10 10  CREATININE 0.77 0.81   Cardiac Panel (last 3 results)  Basename 01/07/12 0510 01/07/12 0018 01/06/12 1836  CKTOTAL -- -- 362*  CKMB -- -- 46.4*  TROPONINI 6.56* 7.50* 2.93*  RELINDX -- -- 12.8*    Hepatic Function Panel  Basename  01/06/12 1002  PROT 7.1  ALBUMIN 3.9  AST 25  ALT 25  ALKPHOS 84  BILITOT 0.3  BILIDIR --  IBILI --   Lipid Panel     Component Value Date/Time   CHOL 228* 01/06/2012 1836   TRIG 521* 01/06/2012 1836   HDL 29* 01/06/2012 1836   CHOLHDL 7.9 01/06/2012 1836   VLDL UNABLE TO CALCULATE IF TRIGLYCERIDE OVER 400 mg/dL 1/61/0960 4540   LDLCALC UNABLE TO CALCULATE IF TRIGLYCERIDE OVER 400 mg/dL 9/81/1914 7829    Cardiac Studies: EKG 01/07/2012: Normal sinus rhythm, normal intervals.  1 mm ST depression in inferior and lateral leads with T wave inversion suggestive of inferolateral ischemia.  Compared to the EKG done on 01/06/2012, ST depression  Minimally more pronounced.  Assessment/Plan:  1.  Non-ST elevation myocardial infarction. 2.  Multi-vessel coronary artery disease involving the LAD, circumflex coronary artery, ramus intermediate, right coronary artery all needing bypass surgery. 3. Familial mixed Hyperlipidemia  4.  Tobacco use disorder, Consult against tobacco use 5.  Hypertension.  Recommendations: I have stopped simvastatin and switched him over to atorvastatin 80 mg by mouth daily.  Otherwise he is on appropriate medical therapy.  Patient is waiting on CABG.   Pamella Pert, M.D. 01/07/2012, 4:58 PM

## 2012-01-08 LAB — CBC
MCH: 31.3 pg (ref 26.0–34.0)
MCHC: 35 g/dL (ref 30.0–36.0)
Platelets: 216 10*3/uL (ref 150–400)
RDW: 11.8 % (ref 11.5–15.5)

## 2012-01-08 LAB — HEPARIN LEVEL (UNFRACTIONATED)
Heparin Unfractionated: 0.21 IU/mL — ABNORMAL LOW (ref 0.30–0.70)
Heparin Unfractionated: 0.26 IU/mL — ABNORMAL LOW (ref 0.30–0.70)
Heparin Unfractionated: 0.5 IU/mL (ref 0.30–0.70)

## 2012-01-08 LAB — GLUCOSE, CAPILLARY
Glucose-Capillary: 233 mg/dL — ABNORMAL HIGH (ref 70–99)
Glucose-Capillary: 137 mg/dL — ABNORMAL HIGH (ref 70–99)

## 2012-01-08 MED ORDER — NITROGLYCERIN IN D5W 200-5 MCG/ML-% IV SOLN: 2.0000 ug/min | INTRAVENOUS | Status: DC

## 2012-01-08 MED ORDER — INSULIN ASPART 100 UNIT/ML ~~LOC~~ SOLN
0.0000 [IU] | Freq: Three times a day (TID) | SUBCUTANEOUS | Status: DC
  Administered 2012-01-08: 3 [IU] via SUBCUTANEOUS
  Administered 2012-01-08: 2 [IU] via SUBCUTANEOUS
  Administered 2012-01-09: 3 [IU] via SUBCUTANEOUS
  Administered 2012-01-09: 1 [IU] via SUBCUTANEOUS
  Administered 2012-01-09: 2 [IU] via SUBCUTANEOUS

## 2012-01-08 NOTE — Progress Notes (Signed)
ANTICOAGULATION CONSULT NOTE - Follow Up Consult  Pharmacy Consult for heparin Indication: CAD awaiting CABG  Labs:  Basename 01/08/12 0256 01/07/12 2037 01/07/12 1300 01/07/12 0520 01/07/12 0510 01/07/12 0018 01/06/12 1836 01/06/12 1002  HGB 13.3 -- -- 15.0 -- -- -- --  HCT 38.0* -- -- 43.1 -- -- -- 45.6  PLT 216 -- -- 234 -- -- -- 311  APTT -- -- -- -- -- -- -- --  LABPROT -- -- -- -- -- -- -- 12.1  INR -- -- -- -- -- -- -- 0.88  HEPARINUNFRC 0.21* 0.24* <0.10* -- -- -- -- --  CREATININE -- -- -- 0.77 -- -- -- 0.81  CKTOTAL -- -- -- -- -- -- 362* --  CKMB -- -- -- -- -- -- 46.4* --  TROPONINI -- -- -- -- 6.56* 7.50* 2.93* --    Assessment: 36yo male with reduced heparin level despite increased rate; no IV problems per RN though new bag was hung earlier.  Goal of Therapy:  Heparin level 0.3-0.7 units/ml   Plan:  Will increase heparin gtt by 3 units/kg/hr to 1850 units/hr and check level in 6hr.  Colleen Can PharmD BCPS 01/08/2012,4:02 AM

## 2012-01-08 NOTE — Progress Notes (Signed)
ANTICOAGULATION CONSULT NOTE - Follow Up Consult  Pharmacy Consult for heparin Indication: CAD awaiting CABG  Patient Measurements:  Height: 5\' 3"  (160 cm)  Weight: 175 lb (79.379 kg)  IBW/kg (Calculated) : 56.9  Heparin Dosing Weight: 74 kg  Labs:  Basename 01/08/12 1750 01/08/12 0926 01/08/12 0256 01/07/12 0520 01/07/12 0510 01/07/12 0018 01/06/12 1836 01/06/12 1002  HGB -- -- 13.3 15.0 -- -- -- --  HCT -- -- 38.0* 43.1 -- -- -- 45.6  PLT -- -- 216 234 -- -- -- 311  APTT -- -- -- -- -- -- -- --  LABPROT -- -- -- -- -- -- -- 12.1  INR -- -- -- -- -- -- -- 0.88  HEPARINUNFRC 0.50 0.26* 0.21* -- -- -- -- --  CREATININE -- -- -- 0.77 -- -- -- 0.81  CKTOTAL -- -- -- -- -- -- 362* --  CKMB -- -- -- -- -- -- 46.4* --  TROPONINI -- -- -- -- 6.56* 7.50* 2.93* --    Assessment: 36 yo male on IV heparin for CAD awaiting CABG on Monday. Heparin level at goal after rate increase earlier today.  No complications noted.  Goal of Therapy:  Heparin level 0.3-0.7 units/ml   Plan:  - Continue heparin at 2000 units/hr - Continue daily heparin level and CBC  Alyia Lacerte L. Illene Bolus, PharmD, BCPS Clinical Pharmacist Pager: [phone] Pharmacy: [phone] 01/08/2012 7:02 PM

## 2012-01-08 NOTE — Progress Notes (Signed)
CARDIAC REHAB PHASE I   PRE:  Rate/Rhythm: 97 sinus  BP:  Sitting: 112/66   SaO2: 98% RA  MODE:  Ambulation: 350 ft   POST:  Rate/Rhythem: 99 sinus  BP:  Sitting: 109/57   SaO2: 98% RA  Order received and appreciated.   Chart reviewed.  Pt ambulated 34ft with assist x1.  Pt tolerated walk well without c/o.  Steady, slow gait.  No chest pain with walk.  Pt returned to bed after walk for lunch with call bell in reach.  We will f/u on Mon/Tues after CABG. Fabio Pierce, MA, ACSM RCEP [phone]  Hazle Nordmann

## 2012-01-08 NOTE — Progress Notes (Addendum)
Subjective:  Patient is currently feeling well.  He has had mild occasional chest pains.   Objective:  Vital Signs in the last 24 hours: Temp:  [97.4 F (36.3 C)-98.6 F (37 C)] 98 F (36.7 C) (09/14 0757) Resp:  [16-24] 20  (09/13 1600) BP: (85-119)/(46-89) 111/55 mmHg (09/14 0700) SpO2:  [93 %-94 %] 93 % (09/14 0000)  Intake/Output from previous day: 09/13 0701 - 09/14 0700 In: 1988.9 [P.O.:1320; I.V.:668.9] Out: 1150 [Urine:1150]  Physical Exam:  General appearance: alert, cooperative, appears older than stated age, no distress and Keeps shaking his legs and states it is due to chest pain.  Eyes: conjunctivae/corneas clear. PERRL, EOM's intact. Fundi benign.  Neck: no adenopathy, no carotid bruit, no JVD, supple, symmetrical, trachea midline and thyroid not enlarged, symmetric, no tenderness/mass/nodules  Neck: JVP - normal, carotids 2+= without bruits  Resp: clear to auscultation bilaterally  Chest wall: no tenderness  Cardio: regular rate and rhythm, S1, S2 normal, no murmur, click, rub or gallop  GI: soft, non-tender; bowel sounds normal; no masses, no organomegaly  Extremities: extremities normal, atraumatic, no cyanosis or edema  Pulses: 2+ and symmetric  Skin: Skin color, texture, turgor normal. No rashes or lesions  Neurologic: Alert and oriented X 3.    Lab Results:  Basename 01/08/12 0256 01/07/12 0520  WBC 11.2* 13.2*  HGB 13.3 15.0  PLT 216 234    Basename 01/07/12 0520 01/06/12 1002  NA 140 138  K 4.0 4.1  CL 102 100  CO2 27 23  GLUCOSE 93 141*  BUN 10 10  CREATININE 0.77 0.81   Cardiac Panel (last 3 results)  Basename 01/07/12 0510 01/07/12 0018 01/06/12 1836  CKTOTAL -- -- 362*  CKMB -- -- 46.4*  TROPONINI 6.56* 7.50* 2.93*  RELINDX -- -- 12.8*    Hepatic Function Panel  Basename 01/06/12 1002  PROT 7.1  ALBUMIN 3.9  AST 25  ALT 25  ALKPHOS 84  BILITOT 0.3  BILIDIR --  IBILI --   Lipid Panel     Component Value Date/Time   CHOL 228* 01/06/2012 1836   TRIG 521* 01/06/2012 1836   HDL 29* 01/06/2012 1836   CHOLHDL 7.9 01/06/2012 1836   VLDL UNABLE TO CALCULATE IF TRIGLYCERIDE OVER 400 mg/dL 1/61/0960 4540   LDLCALC UNABLE TO CALCULATE IF TRIGLYCERIDE OVER 400 mg/dL 9/81/1914 7829   glycosylated hemoglobin 5.8  Cardiac Studies: EKG 01/07/2012: Normal sinus rhythm, normal intervals.  1 mm ST depression in inferior and lateral leads with T wave inversion suggestive of inferolateral ischemia.  Compared to the EKG done on 01/06/2012, ST depression  Minimally more pronounced.  Assessment/Plan:  1.  Non-ST elevation myocardial infarction. 2.  Multi-vessel coronary artery disease involving the LAD, circumflex coronary artery, ramus intermediate, right coronary artery all needing bypass surgery. 3. Familial mixed Hyperlipidemia  4.  Tobacco use disorder, Consult against tobacco use 5.  Hypertension 6. Hyperglycemia.  Recommendations: I have stopped simvastatin and switched him over to atorvastatin 80 mg by mouth daily.  Otherwise he is on appropriate medical therapy.  Patient is waiting on CABG. May need sliding scale coverage of insulin post prandial. Again discussed smoking cessation.    Pamella Pert, M.D. 01/08/2012, 8:43 AM  Patient will be seen by Dr. Pierre Bali al tomorrow.

## 2012-01-08 NOTE — Progress Notes (Signed)
ANTICOAGULATION CONSULT NOTE - Follow Up Consult  Pharmacy Consult for heparin Indication: CAD awaiting CABG  Patient Measurements:  Height: 5\' 3"  (160 cm)  Weight: 175 lb (79.379 kg)  IBW/kg (Calculated) : 56.9  Heparin Dosing Weight: 74 kg  Labs:  Basename 01/08/12 0926 01/08/12 0256 01/07/12 2037 01/07/12 0520 01/07/12 0510 01/07/12 0018 01/06/12 1836 01/06/12 1002  HGB -- 13.3 -- 15.0 -- -- -- --  HCT -- 38.0* -- 43.1 -- -- -- 45.6  PLT -- 216 -- 234 -- -- -- 311  APTT -- -- -- -- -- -- -- --  LABPROT -- -- -- -- -- -- -- 12.1  INR -- -- -- -- -- -- -- 0.88  HEPARINUNFRC 0.26* 0.21* 0.24* -- -- -- -- --  CREATININE -- -- -- 0.77 -- -- -- 0.81  CKTOTAL -- -- -- -- -- -- 362* --  CKMB -- -- -- -- -- -- 46.4* --  TROPONINI -- -- -- -- 6.56* 7.50* 2.93* --    Assessment: 36yo male on IV heparin for CAD awaiting CABG on Monday. Heparin level (0.26) subtherapeutic, trending up with rate increase. Hgb13.3, plt 216, no bleeding noted per chart.  Goal of Therapy:  Heparin level 0.3-0.7 units/ml   Plan:  Will increase heparin gtt by 2 units/kg/hr to 2000 units/hr Recheck heparin level in 6 hrs  Bayard Hugger, PharmD, BCPS  Clinical Pharmacist  Pager: [phone]  01/08/2012,10:58 AM

## 2012-01-09 ENCOUNTER — Inpatient Hospital Stay (HOSPITAL_COMMUNITY): Payer: Medicaid Other

## 2012-01-09 LAB — PREPARE RBC (CROSSMATCH)

## 2012-01-09 LAB — BLOOD GAS, ARTERIAL
Acid-Base Excess: 1.7 mmol/L (ref 0.0–2.0)
Bicarbonate: 26.1 mEq/L — ABNORMAL HIGH (ref 20.0–24.0)
FIO2: 0.21 %
O2 Saturation: 94.9 %
Patient temperature: 98.6
TCO2: 27.5 mmol/L (ref 0–100)
pCO2 arterial: 43.9 mmHg (ref 35.0–45.0)
pH, Arterial: 7.393 (ref 7.350–7.450)
pO2, Arterial: 71.9 mmHg — ABNORMAL LOW (ref 80.0–100.0)

## 2012-01-09 LAB — CBC
Hemoglobin: 13.6 g/dL (ref 13.0–17.0)
MCHC: 34.4 g/dL (ref 30.0–36.0)
RBC: 4.3 MIL/uL (ref 4.22–5.81)
WBC: 11.7 10*3/uL — ABNORMAL HIGH (ref 4.0–10.5)

## 2012-01-09 LAB — GLUCOSE, CAPILLARY
Glucose-Capillary: 216 mg/dL — ABNORMAL HIGH (ref 70–99)
Glucose-Capillary: 134 mg/dL — ABNORMAL HIGH (ref 70–99)

## 2012-01-09 LAB — ABO/RH: ABO/RH(D): O POS

## 2012-01-09 LAB — HEPARIN LEVEL (UNFRACTIONATED): Heparin Unfractionated: 0.53 IU/mL (ref 0.30–0.70)

## 2012-01-09 MED ORDER — DEXMEDETOMIDINE HCL IN NACL 400 MCG/100ML IV SOLN
0.1000 ug/kg/h | INTRAVENOUS | Status: AC
  Administered 2012-01-10: .2 ug/kg/h via INTRAVENOUS
  Filled 2012-01-09: qty 100

## 2012-01-09 MED ORDER — DEXTROSE 5 % IV SOLN
750.0000 mg | INTRAVENOUS | Status: DC
  Filled 2012-01-09 (×2): qty 750

## 2012-01-09 MED ORDER — SODIUM BICARBONATE 8.4 % IV SOLN
INTRAVENOUS | Status: AC
  Administered 2012-01-10: 09:00:00
  Filled 2012-01-09: qty 2.5

## 2012-01-09 MED ORDER — VANCOMYCIN HCL 1000 MG IV SOLR
1250.0000 mg | INTRAVENOUS | Status: AC
  Administered 2012-01-10: 1250 mg via INTRAVENOUS
  Filled 2012-01-09: qty 1250

## 2012-01-09 MED ORDER — EPINEPHRINE HCL 1 MG/ML IJ SOLN
0.5000 ug/min | INTRAVENOUS | Status: DC
  Filled 2012-01-09: qty 4

## 2012-01-09 MED ORDER — POTASSIUM CHLORIDE 2 MEQ/ML IV SOLN
80.0000 meq | INTRAVENOUS | Status: DC
  Filled 2012-01-09: qty 40

## 2012-01-09 MED ORDER — DEXTROSE 5 % IV SOLN
1.5000 g | INTRAVENOUS | Status: AC
  Administered 2012-01-10: 1.5 g via INTRAVENOUS
  Filled 2012-01-09: qty 1.5

## 2012-01-09 MED ORDER — NITROGLYCERIN IN D5W 200-5 MCG/ML-% IV SOLN
2.0000 ug/min | INTRAVENOUS | Status: DC
  Filled 2012-01-09: qty 250

## 2012-01-09 MED ORDER — SODIUM CHLORIDE 0.9 % IV SOLN
INTRAVENOUS | Status: AC
  Administered 2012-01-10: 1 [IU]/h via INTRAVENOUS
  Filled 2012-01-09: qty 1

## 2012-01-09 MED ORDER — TRANEXAMIC ACID (OHS) PUMP PRIME SOLUTION
2.0000 mg/kg | INTRAVENOUS | Status: DC
Start: 2012-01-10 — End: 2012-01-10
  Filled 2012-01-09: qty 1.48

## 2012-01-09 MED ORDER — PHENYLEPHRINE HCL 10 MG/ML IJ SOLN
30.0000 ug/min | INTRAVENOUS | Status: DC
  Filled 2012-01-09: qty 2

## 2012-01-09 MED ORDER — TRANEXAMIC ACID 100 MG/ML IV SOLN
1.5000 mg/kg/h | INTRAVENOUS | Status: AC
  Administered 2012-01-10: 1.5 mg/kg/h via INTRAVENOUS
  Filled 2012-01-09: qty 25

## 2012-01-09 MED ORDER — DOPAMINE-DEXTROSE 3.2-5 MG/ML-% IV SOLN
2.0000 ug/kg/min | INTRAVENOUS | Status: DC
  Administered 2012-01-10: 3 ug/kg/min via INTRAVENOUS
  Filled 2012-01-09: qty 250

## 2012-01-09 MED ORDER — CEFUROXIME SODIUM 1.5 G IJ SOLR
2.0000 g | INTRAMUSCULAR | Status: DC
  Filled 2012-01-09: qty 2

## 2012-01-09 MED ORDER — TRANEXAMIC ACID (OHS) BOLUS VIA INFUSION
15.0000 mg/kg | INTRAVENOUS | Status: AC
  Administered 2012-01-10: 740 mg via INTRAVENOUS
  Filled 2012-01-09: qty 1110

## 2012-01-09 MED ORDER — MAGNESIUM SULFATE 50 % IJ SOLN
40.0000 meq | INTRAMUSCULAR | Status: DC
  Filled 2012-01-09: qty 10

## 2012-01-09 NOTE — Progress Notes (Signed)
ANTICOAGULATION CONSULT NOTE - Follow Up Consult  Pharmacy Consult for heparin Indication: CAD awaiting CABG  Patient Measurements:  Height: 5\' 3"  (160 cm)  Weight: 175 lb (79.379 kg)  IBW/kg (Calculated) : 56.9  Heparin Dosing Weight: 74 kg  Labs:  Basename 01/09/12 0514 01/08/12 1750 01/08/12 0926 01/08/12 0256 01/07/12 0520 01/07/12 0510 01/07/12 0018 01/06/12 1836 01/06/12 1002  HGB 13.6 -- -- 13.3 -- -- -- -- --  HCT 39.5 -- -- 38.0* 43.1 -- -- -- --  PLT 226 -- -- 216 234 -- -- -- --  APTT -- -- -- -- -- -- -- -- --  LABPROT -- -- -- -- -- -- -- -- 12.1  INR -- -- -- -- -- -- -- -- 0.88  HEPARINUNFRC 0.53 0.50 0.26* -- -- -- -- -- --  CREATININE -- -- -- -- 0.77 -- -- -- 0.81  CKTOTAL -- -- -- -- -- -- -- 362* --  CKMB -- -- -- -- -- -- -- 46.4* --  TROPONINI -- -- -- -- -- 6.56* 7.50* 2.93* --    Assessment: 36 yo male on IV heparin for CAD awaiting CABG on Monday. Heparin level (0.53) therapeutic this morning. Hgb and Plt are stable, no complications noted.  Goal of Therapy:  Heparin level 0.3-0.7 units/ml   Plan:  - Continue heparin at 2000 units/hr - Continue daily heparin level and CBC  Bayard Hugger, PharmD, BCPS  Clinical Pharmacist  Pager: [phone] 01/09/2012 7:42 AM

## 2012-01-09 NOTE — Progress Notes (Addendum)
                   [address]            [place] [address]          [phone]     3 Days Post-Op  Procedure(s) (LRB): LEFT HEART CATHETERIZATION WITH CORONARY/GRAFT ANGIOGRAM () Subjective: Pain controlled , mild DOE  Objective  Telemetry SR  Temp:  [97.7 F (36.5 C)-98.5 F (36.9 C)] 98.4 F (36.9 C) (09/15 0300) Pulse Rate:  [92] 92  (09/14 2000) Resp:  [14-18] 16  (09/15 0300) BP: (92-117)/(50-76) 93/59 mmHg (09/15 0600) SpO2:  [91 %-95 %] 92 % (09/15 0300)   Intake/Output Summary (Last 24 hours) at 01/09/12 1008 Last data filed at 01/09/12 0900  Gross per 24 hour  Intake 1278.08 ml  Output   1400 ml  Net -121.92 ml       General appearance: alert, cooperative and no distress Heart: regular rate and rhythm and S1, S2 normal Lungs: clear to auscultation bilaterally Extremities: no edema  Lab Results:  [facility] 01/07/12 0520  NA 140  K 4.0  CL 102  CO2 27  GLUCOSE 93  BUN 10  CREATININE 0.77  CALCIUM 9.4  MG --  PHOS --   No results found for this basename: AST:2,ALT:2,ALKPHOS:2,BILITOT:2,PROT:2,ALBUMIN:2 in the last 72 hours No results found for this basename: LIPASE:2,AMYLASE:2 in the last 72 hours  Basename 01/09/12 0514 01/08/12 0256  WBC 11.7* 11.2*  NEUTROABS -- --  HGB 13.6 13.3  HCT 39.5 38.0*  MCV 91.9 89.4  PLT 226 216    Basename 01/07/12 0510 01/07/12 0018 01/06/12 1836  CKTOTAL -- -- 362*  CKMB -- -- 46.4*  TROPONINI 6.56* 7.50* 2.93*   No components found with this basename: POCBNP:3  Basename 01/07/12 0520  DDIMER <0.27    Basename 01/07/12 0520  HGBA1C 5.8*    Basename 01/06/12 1836  CHOL 228*  HDL 29*  LDLCALC UNABLE TO CALCULATE IF TRIGLYCERIDE OVER 400 mg/dL  TRIG 253*  CHOLHDL 7.9    Basename 01/07/12 0520  TSH 1.753  T4TOTAL --  T3FREE --  THYROIDAB --   No results found for this basename: VITAMINB12,FOLATE,FERRITIN,TIBC,IRON,RETICCTPCT in the last 72  hours  Medications: Scheduled    . aspirin  81 mg Oral Daily  . atorvastatin  80 mg Oral q1800  . benazepril  20 mg Oral Daily  . bisacodyl  5 mg Oral Once  . budesonide-formoterol  2 puff Inhalation BID  . chlorhexidine  60 mL Topical Once  . Chlorhexidine Gluconate Cloth  6 each Topical Daily  . insulin aspart  0-9 Units Subcutaneous TID WC  . metoprolol tartrate  25 mg Oral Once  . mupirocin ointment  1 application Nasal BID  . omega-3 acid ethyl esters  2 g Oral BID  . pantoprazole  40 mg Oral Q1200  . sodium chloride  3 mL Intravenous Q12H     Radiology/Studies:  No results found.  INR: Will add last result for INR, ABG once components are confirmed Will add last 4 CBG results once components are confirmed  Assessment/Plan: S/P Procedure(s) (LRB): LEFT HEART CATHETERIZATION WITH CORONARY/GRAFT ANGIOGRAM () 1. Stable for OR in AM   LOS: 3 days    GOLD,WAYNE E 9/15/201310:08 AM    Agree with above Carotids OK

## 2012-01-10 ENCOUNTER — Encounter (HOSPITAL_COMMUNITY): Payer: Self-pay | Admitting: Anesthesiology

## 2012-01-10 ENCOUNTER — Inpatient Hospital Stay (HOSPITAL_COMMUNITY): Payer: Medicaid Other

## 2012-01-10 ENCOUNTER — Other Ambulatory Visit: Payer: Self-pay

## 2012-01-10 ENCOUNTER — Inpatient Hospital Stay (HOSPITAL_COMMUNITY): Payer: Medicaid Other | Admitting: Anesthesiology

## 2012-01-10 ENCOUNTER — Encounter (HOSPITAL_COMMUNITY)
Admission: EM | Disposition: A | Discharge status: Home / Self Care | Payer: Self-pay | Source: Home / Self Care | Attending: Cardiothoracic Surgery

## 2012-01-10 DIAGNOSIS — I2510 Atherosclerotic heart disease of native coronary artery without angina pectoris: Secondary | ICD-10-CM

## 2012-01-10 HISTORY — PX: RADIAL ARTERY HARVEST: SHX5067

## 2012-01-10 HISTORY — PX: CORONARY ARTERY BYPASS GRAFT: SHX141

## 2012-01-10 LAB — POCT I-STAT, CHEM 8
Glucose, Bld: 130 mg/dL — ABNORMAL HIGH (ref 70–99)
HCT: 34 % — ABNORMAL LOW (ref 39.0–52.0)
Hemoglobin: 11.6 g/dL — ABNORMAL LOW (ref 13.0–17.0)
Potassium: 4.2 mEq/L (ref 3.5–5.1)

## 2012-01-10 LAB — GLUCOSE, CAPILLARY: Glucose-Capillary: 120 mg/dL — ABNORMAL HIGH (ref 70–99)

## 2012-01-10 LAB — POCT I-STAT 3, ART BLOOD GAS (G3+)
Acid-Base Excess: 2 mmol/L (ref 0.0–2.0)
Acid-Base Excess: 2 mmol/L (ref 0.0–2.0)
Acid-Base Excess: 2 mmol/L (ref 0.0–2.0)
Bicarbonate: 30.1 mEq/L — ABNORMAL HIGH (ref 20.0–24.0)
Bicarbonate: 27.8 mEq/L — ABNORMAL HIGH (ref 20.0–24.0)
Bicarbonate: 28.6 mEq/L — ABNORMAL HIGH (ref 20.0–24.0)
Bicarbonate: 27.3 mEq/L — ABNORMAL HIGH (ref 20.0–24.0)
O2 Saturation: 94 %
O2 Saturation: 93 %
Patient temperature: 37
Patient temperature: 100.6
Patient temperature: 100.3
TCO2: 29 mmol/L (ref 0–100)
TCO2: 29 mmol/L (ref 0–100)
pCO2 arterial: 49.2 mmHg — ABNORMAL HIGH (ref 35.0–45.0)
pCO2 arterial: 52.1 mmHg — ABNORMAL HIGH (ref 35.0–45.0)
pCO2 arterial: 45.5 mmHg — ABNORMAL HIGH (ref 35.0–45.0)
pH, Arterial: 7.395 (ref 7.350–7.450)
pH, Arterial: 7.334 — ABNORMAL LOW (ref 7.350–7.450)

## 2012-01-10 LAB — POCT I-STAT 4, (NA,K, GLUC, HGB,HCT)
Glucose, Bld: 172 mg/dL — ABNORMAL HIGH (ref 70–99)
Glucose, Bld: 110 mg/dL — ABNORMAL HIGH (ref 70–99)
HCT: 37 % — ABNORMAL LOW (ref 39.0–52.0)
HCT: 27 % — ABNORMAL LOW (ref 39.0–52.0)
Hemoglobin: 12.6 g/dL — ABNORMAL LOW (ref 13.0–17.0)
Hemoglobin: 11.2 g/dL — ABNORMAL LOW (ref 13.0–17.0)
Hemoglobin: 9.2 g/dL — ABNORMAL LOW (ref 13.0–17.0)
Potassium: 3.6 mEq/L (ref 3.5–5.1)
Potassium: 3.8 mEq/L (ref 3.5–5.1)
Potassium: 4.4 mEq/L (ref 3.5–5.1)
Potassium: 3.7 mEq/L (ref 3.5–5.1)
Sodium: 139 mEq/L (ref 135–145)
Sodium: 135 mEq/L (ref 135–145)
Sodium: 140 mEq/L (ref 135–145)

## 2012-01-10 LAB — CBC
HCT: 32.2 % — ABNORMAL LOW (ref 39.0–52.0)
Hemoglobin: 13.8 g/dL (ref 13.0–17.0)
Hemoglobin: 10.9 g/dL — ABNORMAL LOW (ref 13.0–17.0)
MCH: 30.9 pg (ref 26.0–34.0)
MCH: 30.8 pg (ref 26.0–34.0)
MCH: 30.9 pg (ref 26.0–34.0)
MCHC: 33.4 g/dL (ref 30.0–36.0)
MCHC: 33.7 g/dL (ref 30.0–36.0)
MCHC: 33.9 g/dL (ref 30.0–36.0)
MCV: 91.2 fL (ref 78.0–100.0)
Platelets: 139 10*3/uL — ABNORMAL LOW (ref 150–400)
Platelets: 168 10*3/uL (ref 150–400)
RBC: 3.53 MIL/uL — ABNORMAL LOW (ref 4.22–5.81)
RDW: 12 % (ref 11.5–15.5)
RDW: 12.2 % (ref 11.5–15.5)
WBC: 17.6 10*3/uL — ABNORMAL HIGH (ref 4.0–10.5)

## 2012-01-10 LAB — CREATININE, SERUM
Creatinine, Ser: 0.64 mg/dL (ref 0.50–1.35)
GFR calc Af Amer: >90 mL/min (ref >90)
GFR calc non Af Amer: >90 mL/min (ref >90)

## 2012-01-10 LAB — HEPATITIS PANEL, ACUTE
HCV Ab: NEGATIVE
Hep A IgM: NEGATIVE
Hep B C IgM: NEGATIVE
Hepatitis B Surface Ag: NEGATIVE

## 2012-01-10 LAB — PLATELET COUNT: Platelets: 156 10*3/uL (ref 150–400)

## 2012-01-10 LAB — MAGNESIUM: Magnesium: 2.5 mg/dL (ref 1.5–2.5)

## 2012-01-10 LAB — HEMOGLOBIN AND HEMATOCRIT, BLOOD
HCT: 24.5 % — ABNORMAL LOW (ref 39.0–52.0)
Hemoglobin: 8.6 g/dL — ABNORMAL LOW (ref 13.0–17.0)

## 2012-01-10 LAB — SURGICAL PCR SCREEN
MRSA, PCR: NEGATIVE
Staphylococcus aureus: POSITIVE — AB

## 2012-01-10 LAB — PROTIME-INR: Prothrombin Time: 16.4 seconds — ABNORMAL HIGH (ref 11.6–15.2)

## 2012-01-10 LAB — APTT: aPTT: 111 seconds — ABNORMAL HIGH (ref 24–37)

## 2012-01-10 SURGERY — REDO CORONARY ARTERY BYPASS GRAFTING (CABG)
Anesthesia: General | Site: Chest | Wound class: Clean

## 2012-01-10 MED ORDER — INSULIN REGULAR BOLUS VIA INFUSION
0.0000 [IU] | Freq: Three times a day (TID) | INTRAVENOUS | Status: DC
  Filled 2012-01-10: qty 10

## 2012-01-10 MED ORDER — NITROGLYCERIN IN D5W 200-5 MCG/ML-% IV SOLN
0.0000 ug/min | INTRAVENOUS | Status: DC
Start: 2012-01-10 — End: 2012-01-11

## 2012-01-10 MED ORDER — LACTATED RINGERS IV SOLN
INTRAVENOUS | Status: DC | PRN
  Administered 2012-01-10 (×2): via INTRAVENOUS

## 2012-01-10 MED ORDER — ALBUMIN HUMAN 5 % IV SOLN: 250.0000 mL | INTRAVENOUS | Status: AC | PRN

## 2012-01-10 MED ORDER — SODIUM CHLORIDE 0.9 % IV SOLN
INTRAVENOUS | Status: DC | PRN
  Administered 2012-01-10: 16:00:00 via INTRAVENOUS

## 2012-01-10 MED ORDER — ACETAMINOPHEN 160 MG/5ML PO SOLN: 650.0000 mg | ORAL | Status: AC

## 2012-01-10 MED ORDER — BISACODYL 5 MG PO TBEC
10.0000 mg | DELAYED_RELEASE_TABLET | Freq: Every day | ORAL | Status: DC
  Administered 2012-01-11 – 2012-01-14 (×3): 10 mg via ORAL
  Filled 2012-01-10 (×3): qty 2

## 2012-01-10 MED ORDER — FENTANYL CITRATE 0.05 MG/ML IJ SOLN
INTRAMUSCULAR | Status: DC | PRN
  Administered 2012-01-10 (×2): 250 ug via INTRAVENOUS
  Administered 2012-01-10: 125 ug via INTRAVENOUS
  Administered 2012-01-10: 850 ug via INTRAVENOUS
  Administered 2012-01-10: 150 ug via INTRAVENOUS
  Administered 2012-01-10 (×3): 250 ug via INTRAVENOUS
  Administered 2012-01-10: 125 ug via INTRAVENOUS

## 2012-01-10 MED ORDER — DEXTROSE 5 % IV SOLN
0.0000 ug/min | INTRAVENOUS | Status: DC
  Filled 2012-01-10: qty 2

## 2012-01-10 MED ORDER — MORPHINE SULFATE 2 MG/ML IJ SOLN
1.0000 mg | INTRAMUSCULAR | Status: AC | PRN
  Administered 2012-01-10: 2 mg via INTRAVENOUS
  Filled 2012-01-10: qty 2

## 2012-01-10 MED ORDER — ACETAMINOPHEN 650 MG RE SUPP
650.0000 mg | RECTAL | Status: AC
  Administered 2012-01-10: 650 mg via RECTAL

## 2012-01-10 MED ORDER — BUDESONIDE 0.5 MG/2ML IN SUSP
0.5000 mg | Freq: Two times a day (BID) | RESPIRATORY_TRACT | Status: DC
  Administered 2012-01-10 – 2012-01-15 (×10): 0.5 mg via RESPIRATORY_TRACT
  Filled 2012-01-10 (×12): qty 2

## 2012-01-10 MED ORDER — MUPIROCIN 2 % EX OINT: TOPICAL_OINTMENT | Freq: Two times a day (BID) | CUTANEOUS | Status: DC

## 2012-01-10 MED ORDER — VANCOMYCIN HCL IN DEXTROSE 1-5 GM/200ML-% IV SOLN
1000.0000 mg | INTRAVENOUS | Status: DC
  Administered 2012-01-10: 1000 mg via INTRAVENOUS
  Filled 2012-01-10 (×2): qty 200

## 2012-01-10 MED ORDER — LACTATED RINGERS IV SOLN: 500.0000 mL | Freq: Once | INTRAVENOUS | Status: AC | PRN

## 2012-01-10 MED ORDER — ROCURONIUM BROMIDE 100 MG/10ML IV SOLN
INTRAVENOUS | Status: DC | PRN
  Administered 2012-01-10: 100 mg via INTRAVENOUS

## 2012-01-10 MED ORDER — SODIUM CHLORIDE 0.9 % IV SOLN: 250.0000 mL | INTRAVENOUS | Status: DC

## 2012-01-10 MED ORDER — SODIUM CHLORIDE 0.9 % IV SOLN
10.0000 mg | INTRAVENOUS | Status: DC | PRN
  Administered 2012-01-10: 50 ug/min via INTRAVENOUS

## 2012-01-10 MED ORDER — ALBUMIN HUMAN 5 % IV SOLN
INTRAVENOUS | Status: DC | PRN
  Administered 2012-01-10: 16:00:00 via INTRAVENOUS

## 2012-01-10 MED ORDER — OXYCODONE HCL 5 MG PO TABS
5.0000 mg | ORAL_TABLET | ORAL | Status: DC | PRN
  Administered 2012-01-11 (×3): 10 mg via ORAL
  Filled 2012-01-10 (×3): qty 2

## 2012-01-10 MED ORDER — MIDAZOLAM HCL 5 MG/5ML IJ SOLN
INTRAMUSCULAR | Status: DC | PRN
  Administered 2012-01-10: 3 mg via INTRAVENOUS
  Administered 2012-01-10 (×2): 2 mg via INTRAVENOUS
  Administered 2012-01-10: 4 mg via INTRAVENOUS
  Administered 2012-01-10: 5 mg via INTRAVENOUS

## 2012-01-10 MED ORDER — LEVALBUTEROL HCL 1.25 MG/0.5ML IN NEBU
1.2500 mg | INHALATION_SOLUTION | Freq: Four times a day (QID) | RESPIRATORY_TRACT | Status: DC
  Administered 2012-01-10 – 2012-01-14 (×14): 1.25 mg via RESPIRATORY_TRACT
  Filled 2012-01-10 (×20): qty 0.5

## 2012-01-10 MED ORDER — ISOSORBIDE MONONITRATE 15 MG HALF TABLET
15.0000 mg | ORAL_TABLET | Freq: Every day | ORAL | Status: DC
  Filled 2012-01-10: qty 1

## 2012-01-10 MED ORDER — PROTAMINE SULFATE 10 MG/ML IV SOLN
INTRAVENOUS | Status: DC | PRN
  Administered 2012-01-10: 300 mg via INTRAVENOUS

## 2012-01-10 MED ORDER — HEMOSTATIC AGENTS (NO CHARGE) OPTIME
TOPICAL | Status: DC | PRN
  Administered 2012-01-10: 1 via TOPICAL

## 2012-01-10 MED ORDER — DEXTROSE 5 % IV SOLN
1.5000 g | Freq: Two times a day (BID) | INTRAVENOUS | Status: AC
  Administered 2012-01-10 – 2012-01-12 (×4): 1.5 g via INTRAVENOUS
  Filled 2012-01-10 (×4): qty 1.5

## 2012-01-10 MED ORDER — BISACODYL 10 MG RE SUPP: 10.0000 mg | Freq: Every day | RECTAL | Status: DC

## 2012-01-10 MED ORDER — MORPHINE SULFATE 2 MG/ML IJ SOLN
2.0000 mg | INTRAMUSCULAR | Status: DC | PRN
  Administered 2012-01-10: 2 mg via INTRAVENOUS
  Administered 2012-01-10 – 2012-01-11 (×10): 4 mg via INTRAVENOUS
  Administered 2012-01-11: 2 mg via INTRAVENOUS
  Administered 2012-01-12 (×2): 4 mg via INTRAVENOUS
  Administered 2012-01-12: 5 mg via INTRAVENOUS
  Administered 2012-01-12: 4 mg via INTRAVENOUS
  Administered 2012-01-12 (×2): 2 mg via INTRAVENOUS
  Administered 2012-01-13 (×2): 4 mg via INTRAVENOUS
  Filled 2012-01-10 (×3): qty 2
  Filled 2012-01-10 (×2): qty 1
  Filled 2012-01-10 (×5): qty 2
  Filled 2012-01-10: qty 1
  Filled 2012-01-10 (×3): qty 2
  Filled 2012-01-10: qty 3
  Filled 2012-01-10 (×4): qty 2

## 2012-01-10 MED ORDER — ACETAMINOPHEN 160 MG/5ML PO SOLN
975.0000 mg | Freq: Four times a day (QID) | ORAL | Status: DC
  Administered 2012-01-10: 975 mg

## 2012-01-10 MED ORDER — SODIUM CHLORIDE 0.9 % IJ SOLN
OROMUCOSAL | Status: DC | PRN
  Administered 2012-01-10: 09:00:00 via TOPICAL

## 2012-01-10 MED ORDER — SODIUM CHLORIDE 0.9 % IJ SOLN
3.0000 mL | Freq: Two times a day (BID) | INTRAMUSCULAR | Status: DC
  Administered 2012-01-11 – 2012-01-14 (×8): 3 mL via INTRAVENOUS

## 2012-01-10 MED ORDER — HEPARIN SODIUM (PORCINE) 1000 UNIT/ML IJ SOLN
INTRAMUSCULAR | Status: DC | PRN
  Administered 2012-01-10: 3000 [IU] via INTRAVENOUS
  Administered 2012-01-10: 30000 [IU] via INTRAVENOUS
  Administered 2012-01-10: 3000 [IU] via INTRAVENOUS

## 2012-01-10 MED ORDER — MAGNESIUM SULFATE 40 MG/ML IJ SOLN
4.0000 g | Freq: Once | INTRAMUSCULAR | Status: AC
  Administered 2012-01-10: 4 g via INTRAVENOUS
  Filled 2012-01-10: qty 100

## 2012-01-10 MED ORDER — SODIUM BICARBONATE 8.4 % IV SOLN
INTRAVENOUS | Status: AC
  Administered 2012-01-10: 12:00:00
  Filled 2012-01-10 (×2): qty 2.5

## 2012-01-10 MED ORDER — VECURONIUM BROMIDE 10 MG IV SOLR
INTRAVENOUS | Status: DC | PRN
  Administered 2012-01-10 (×6): 5 mg via INTRAVENOUS

## 2012-01-10 MED ORDER — POTASSIUM CHLORIDE 10 MEQ/50ML IV SOLN
10.0000 meq | INTRAVENOUS | Status: AC
  Administered 2012-01-10 (×3): 10 meq via INTRAVENOUS

## 2012-01-10 MED ORDER — LACTATED RINGERS IV SOLN
INTRAVENOUS | Status: DC | PRN
  Administered 2012-01-10 (×2): via INTRAVENOUS

## 2012-01-10 MED ORDER — ASPIRIN EC 325 MG PO TBEC
325.0000 mg | DELAYED_RELEASE_TABLET | Freq: Every day | ORAL | Status: DC
  Administered 2012-01-11 – 2012-01-15 (×5): 325 mg via ORAL
  Filled 2012-01-10 (×5): qty 1

## 2012-01-10 MED ORDER — CEFUROXIME SODIUM 750 MG IJ SOLR
INTRAMUSCULAR | Status: DC | PRN
  Administered 2012-01-10: 750 mg via INTRAMUSCULAR

## 2012-01-10 MED ORDER — ASPIRIN 81 MG PO CHEW: 324.0000 mg | CHEWABLE_TABLET | Freq: Every day | ORAL | Status: DC

## 2012-01-10 MED ORDER — ONDANSETRON HCL 4 MG/2ML IJ SOLN: 4.0000 mg | Freq: Four times a day (QID) | INTRAMUSCULAR | Status: DC | PRN

## 2012-01-10 MED ORDER — METOPROLOL TARTRATE 12.5 MG HALF TABLET
12.5000 mg | ORAL_TABLET | Freq: Two times a day (BID) | ORAL | Status: DC
  Administered 2012-01-11: 12.5 mg via ORAL
  Filled 2012-01-10 (×5): qty 1

## 2012-01-10 MED ORDER — PANTOPRAZOLE SODIUM 40 MG PO TBEC
40.0000 mg | DELAYED_RELEASE_TABLET | Freq: Every day | ORAL | Status: DC
  Administered 2012-01-12 – 2012-01-15 (×4): 40 mg via ORAL
  Filled 2012-01-10 (×3): qty 1

## 2012-01-10 MED ORDER — METOPROLOL TARTRATE 25 MG/10 ML ORAL SUSPENSION
12.5000 mg | Freq: Two times a day (BID) | ORAL | Status: DC
  Administered 2012-01-11: 12.5 mg
  Filled 2012-01-10 (×5): qty 5

## 2012-01-10 MED ORDER — CHLORHEXIDINE GLUCONATE CLOTH 2 % EX PADS: 6.0000 | MEDICATED_PAD | Freq: Every day | CUTANEOUS | Status: DC

## 2012-01-10 MED ORDER — ACETAMINOPHEN 500 MG PO TABS
1000.0000 mg | ORAL_TABLET | Freq: Four times a day (QID) | ORAL | Status: DC
  Administered 2012-01-11 (×2): 1000 mg via ORAL
  Filled 2012-01-10 (×5): qty 2

## 2012-01-10 MED ORDER — PROPOFOL 10 MG/ML IV BOLUS
INTRAVENOUS | Status: DC | PRN
  Administered 2012-01-10: 50 mg via INTRAVENOUS

## 2012-01-10 MED ORDER — 0.9 % SODIUM CHLORIDE (POUR BTL) OPTIME
TOPICAL | Status: DC | PRN
  Administered 2012-01-10: 1000 mL

## 2012-01-10 MED ORDER — METOPROLOL TARTRATE 1 MG/ML IV SOLN: 2.5000 mg | INTRAVENOUS | Status: DC | PRN

## 2012-01-10 MED ORDER — MIDAZOLAM HCL 2 MG/2ML IJ SOLN: 2.0000 mg | INTRAMUSCULAR | Status: DC | PRN

## 2012-01-10 MED ORDER — DEXMEDETOMIDINE HCL IN NACL 200 MCG/50ML IV SOLN
0.1000 ug/kg/h | INTRAVENOUS | Status: DC
  Administered 2012-01-10: 0.5 ug/kg/h via INTRAVENOUS

## 2012-01-10 MED ORDER — OMEGA-3-ACID ETHYL ESTERS 1 G PO CAPS
2.0000 g | ORAL_CAPSULE | Freq: Two times a day (BID) | ORAL | Status: DC
  Administered 2012-01-11 – 2012-01-15 (×9): 2 g via ORAL
  Filled 2012-01-10 (×10): qty 2

## 2012-01-10 MED ORDER — DOPAMINE-DEXTROSE 3.2-5 MG/ML-% IV SOLN: 0.0000 ug/kg/min | INTRAVENOUS | Status: DC

## 2012-01-10 MED ORDER — SODIUM CHLORIDE 0.45 % IV SOLN: INTRAVENOUS | Status: DC

## 2012-01-10 MED ORDER — DOCUSATE SODIUM 100 MG PO CAPS
200.0000 mg | ORAL_CAPSULE | Freq: Every day | ORAL | Status: DC
  Administered 2012-01-11 – 2012-01-14 (×3): 200 mg via ORAL
  Filled 2012-01-10 (×4): qty 2

## 2012-01-10 MED ORDER — SODIUM CHLORIDE 0.9 % IV SOLN
INTRAVENOUS | Status: DC
  Administered 2012-01-11 – 2012-01-12 (×2): via INTRAVENOUS

## 2012-01-10 MED ORDER — LACTATED RINGERS IV SOLN: INTRAVENOUS | Status: DC

## 2012-01-10 MED ORDER — SODIUM CHLORIDE 0.9 % IJ SOLN: 3.0000 mL | INTRAMUSCULAR | Status: DC | PRN

## 2012-01-10 MED ORDER — SODIUM CHLORIDE 0.9 % IV SOLN
INTRAVENOUS | Status: DC
  Filled 2012-01-10: qty 1

## 2012-01-10 MED ORDER — FAMOTIDINE IN NACL 20-0.9 MG/50ML-% IV SOLN
20.0000 mg | Freq: Two times a day (BID) | INTRAVENOUS | Status: DC
  Administered 2012-01-10: 20 mg via INTRAVENOUS

## 2012-01-10 MED ORDER — LACTATED RINGERS IV SOLN
INTRAVENOUS | Status: DC | PRN
  Administered 2012-01-10 (×2): via INTRAVENOUS

## 2012-01-10 MED FILL — Magnesium Sulfate Inj 50%: INTRAMUSCULAR | Qty: 2 | Status: AC

## 2012-01-10 MED FILL — Potassium Chloride Inj 2 mEq/ML: INTRAVENOUS | Qty: 40 | Status: AC

## 2012-01-10 SURGICAL SUPPLY — 144 items
ADAPTER CARDIO PERF ANTE/RETRO (ADAPTER) IMPLANT
APPLIER CLIP 9.375 MED OPEN (MISCELLANEOUS)
APPLIER CLIP 9.375 SM OPEN (CLIP) ×4
ATTRACTOMAT 16X20 MAGNETIC DRP (DRAPES) ×4 IMPLANT
BAG DECANTER FOR FLEXI CONT (MISCELLANEOUS) ×8 IMPLANT
BANDAGE ELASTIC 4 VELCRO ST LF (GAUZE/BANDAGES/DRESSINGS) ×8 IMPLANT
BANDAGE ELASTIC 6 VELCRO ST LF (GAUZE/BANDAGES/DRESSINGS) ×4 IMPLANT
BANDAGE GAUZE ELAST BULKY 4 IN (GAUZE/BANDAGES/DRESSINGS) ×8 IMPLANT
BLADE CORE FAN STRYKER (BLADE) ×4 IMPLANT
BLADE SAW SAG 29X58X.64 (BLADE) ×4 IMPLANT
BLADE STERNUM SYSTEM 6 (BLADE) ×4 IMPLANT
BLADE SURG 11 STRL SS (BLADE) ×4 IMPLANT
BLADE SURG 12 STRL SS (BLADE) ×4 IMPLANT
BLADE SURG 15 STRL LF DISP TIS (BLADE) ×2 IMPLANT
BLADE SURG 15 STRL SS (BLADE) ×2
BLADE SURG ROTATE 9660 (MISCELLANEOUS) ×8 IMPLANT
CANISTER SUCTION 2500CC (MISCELLANEOUS) ×4 IMPLANT
CANNULA GUNDRY RCSP 15FR (MISCELLANEOUS) IMPLANT
CANNULA SOFTFLOW AORTIC 7M21FR (CANNULA) ×4 IMPLANT
CATH RETROPLEGIA CORONARY 14FR (CATHETERS) IMPLANT
CATH ROBINSON RED A/P 18FR (CATHETERS) ×12 IMPLANT
CATH THORACIC 28FR (CATHETERS) IMPLANT
CATH THORACIC 28FR RT ANG (CATHETERS) IMPLANT
CATH THORACIC 36FR (CATHETERS) IMPLANT
CATH THORACIC 36FR RT ANG (CATHETERS) ×8 IMPLANT
CLIP APPLIE 9.375 MED OPEN (MISCELLANEOUS) IMPLANT
CLIP APPLIE 9.375 SM OPEN (CLIP) ×2 IMPLANT
CLIP FOGARTY SPRING 6M (CLIP) ×4 IMPLANT
CLIP TI MEDIUM 24 (CLIP) IMPLANT
CLIP TI WIDE RED SMALL 24 (CLIP) ×12 IMPLANT
CLOTH BEACON ORANGE TIMEOUT ST (SAFETY) ×8 IMPLANT
CONN Y 3/8X3/8X3/8  BEN (MISCELLANEOUS)
CONN Y 3/8X3/8X3/8 BEN (MISCELLANEOUS) IMPLANT
CORONARY SUCKER SOFT TIP 10052 (MISCELLANEOUS) IMPLANT
COVER MAYO STAND STRL (DRAPES) ×8 IMPLANT
COVER SURGICAL LIGHT HANDLE (MISCELLANEOUS) ×8 IMPLANT
CRADLE DONUT ADULT HEAD (MISCELLANEOUS) ×4 IMPLANT
DERMABOND ADVANCED (GAUZE/BANDAGES/DRESSINGS) ×2
DERMABOND ADVANCED .7 DNX12 (GAUZE/BANDAGES/DRESSINGS) ×2 IMPLANT
DRAPE CARDIOVASCULAR INCISE (DRAPES) ×2
DRAPE EXTREMITY T 121X128X90 (DRAPE) ×4 IMPLANT
DRAPE PROXIMA HALF (DRAPES) IMPLANT
DRAPE SLUSH MACHINE 52X66 (DRAPES) IMPLANT
DRAPE SLUSH/WARMER DISC (DRAPES) ×4 IMPLANT
DRAPE SRG 135X102X78XABS (DRAPES) ×2 IMPLANT
DRSG COVADERM 4X14 (GAUZE/BANDAGES/DRESSINGS) ×4 IMPLANT
ELECT BLADE 6.5 EXT (BLADE) ×8 IMPLANT
ELECT CAUTERY BLADE 6.4 (BLADE) IMPLANT
ELECT REM PT RETURN 9FT ADLT (ELECTROSURGICAL) ×8
ELECTRODE REM PT RTRN 9FT ADLT (ELECTROSURGICAL) ×4 IMPLANT
GAUZE XEROFORM 1X8 LF (GAUZE/BANDAGES/DRESSINGS) ×4 IMPLANT
GEL ULTRASOUND 20GR AQUASONIC (MISCELLANEOUS) ×4 IMPLANT
GLOVE BIO SURGEON STRL SZ 6 (GLOVE) ×4 IMPLANT
GLOVE BIO SURGEON STRL SZ 6.5 (GLOVE) ×6 IMPLANT
GLOVE BIO SURGEON STRL SZ7 (GLOVE) ×12 IMPLANT
GLOVE BIO SURGEON STRL SZ7.5 (GLOVE) ×8 IMPLANT
GLOVE BIO SURGEONS STRL SZ 6.5 (GLOVE) ×2
GLOVE BIOGEL PI IND STRL 6 (GLOVE) ×2 IMPLANT
GLOVE BIOGEL PI IND STRL 6.5 (GLOVE) ×6 IMPLANT
GLOVE BIOGEL PI IND STRL 7.0 (GLOVE) ×12 IMPLANT
GLOVE BIOGEL PI INDICATOR 6 (GLOVE) ×2
GLOVE BIOGEL PI INDICATOR 6.5 (GLOVE) ×6
GLOVE BIOGEL PI INDICATOR 7.0 (GLOVE) ×12
GLOVE EUDERMIC 7 POWDERFREE (GLOVE) ×4 IMPLANT
GLOVE ORTHO TXT STRL SZ7.5 (GLOVE) ×4 IMPLANT
GOWN PREVENTION PLUS XLARGE (GOWN DISPOSABLE) ×16 IMPLANT
GOWN STRL NON-REIN LRG LVL3 (GOWN DISPOSABLE) ×16 IMPLANT
HARMONIC SHEARS 14CM COAG (MISCELLANEOUS) ×4 IMPLANT
HEMOSTAT POWDER SURGIFOAM 1G (HEMOSTASIS) ×12 IMPLANT
HEMOSTAT SURGICEL 2X14 (HEMOSTASIS) ×4 IMPLANT
INSERT FOGARTY 61MM (MISCELLANEOUS) ×4 IMPLANT
INSERT FOGARTY XLG (MISCELLANEOUS) IMPLANT
KIT BASIN OR (CUSTOM PROCEDURE TRAY) ×4 IMPLANT
KIT PAIN CUSTOM (MISCELLANEOUS) IMPLANT
KIT ROOM TURNOVER OR (KITS) ×8 IMPLANT
KIT SUCTION CATH 14FR (SUCTIONS) ×4 IMPLANT
KIT VASOVIEW W/TROCAR VH 2000 (KITS) ×4 IMPLANT
LOOP VESSEL MAXI BLUE (MISCELLANEOUS) ×4 IMPLANT
MARKER GRAFT CORONARY BYPASS (MISCELLANEOUS) ×12 IMPLANT
MATRIX HEMOSTAT SURGIFLO (HEMOSTASIS) ×4 IMPLANT
NS IRRIG 1000ML POUR BTL (IV SOLUTION) ×24 IMPLANT
PACK OPEN HEART (CUSTOM PROCEDURE TRAY) ×4 IMPLANT
PAD ARMBOARD 7.5X6 YLW CONV (MISCELLANEOUS) ×16 IMPLANT
PAD DEFIB R2 (MISCELLANEOUS) ×4 IMPLANT
PENCIL BUTTON HOLSTER BLD 10FT (ELECTRODE) ×4 IMPLANT
PUNCH AORTIC ROTATE 4.0MM (MISCELLANEOUS) ×4 IMPLANT
PUNCH AORTIC ROTATE 4.5MM 8IN (MISCELLANEOUS) IMPLANT
PUNCH AORTIC ROTATE 5MM 8IN (MISCELLANEOUS) IMPLANT
SENSOR MYOCARDIAL TEMP (MISCELLANEOUS) ×4 IMPLANT
SET CARDIOPLEGIA MPS 5001102 (MISCELLANEOUS) ×4 IMPLANT
SOLUTION ANTI FOG 6CC (MISCELLANEOUS) ×4 IMPLANT
SPONGE GAUZE 4X4 12PLY (GAUZE/BANDAGES/DRESSINGS) ×20 IMPLANT
SPONGE INTESTINAL PEANUT (DISPOSABLE) ×4 IMPLANT
SPONGE LAP 18X18 X RAY DECT (DISPOSABLE) ×8 IMPLANT
SPONGE LAP 4X18 X RAY DECT (DISPOSABLE) ×12 IMPLANT
STAPLER VISISTAT 35W (STAPLE) ×4 IMPLANT
SUT BONE WAX W31G (SUTURE) ×4 IMPLANT
SUT ETHIBOND 2 0 SH (SUTURE) ×2
SUT ETHIBOND 2 0 SH 36X2 (SUTURE) ×2 IMPLANT
SUT MNCRL AB 4-0 PS2 18 (SUTURE) ×8 IMPLANT
SUT PROLENE 3 0 SH 1 (SUTURE) IMPLANT
SUT PROLENE 3 0 SH DA (SUTURE) ×4 IMPLANT
SUT PROLENE 3 0 SH1 36 (SUTURE) ×4 IMPLANT
SUT PROLENE 4 0 RB 1 (SUTURE)
SUT PROLENE 4 0 SH DA (SUTURE) ×4 IMPLANT
SUT PROLENE 4-0 RB1 .5 CRCL 36 (SUTURE) IMPLANT
SUT PROLENE 5 0 C 1 36 (SUTURE) ×8 IMPLANT
SUT PROLENE 6 0 C 1 30 (SUTURE) ×12 IMPLANT
SUT PROLENE 6 0 CC (SUTURE) ×4 IMPLANT
SUT PROLENE 7 0 BV 1 (SUTURE) ×4 IMPLANT
SUT PROLENE 7 0 BV1 MDA (SUTURE) ×4 IMPLANT
SUT PROLENE 7 0 DA (SUTURE) IMPLANT
SUT PROLENE 7.0 RB 3 (SUTURE) ×4 IMPLANT
SUT PROLENE 8 0 BV175 6 (SUTURE) ×32 IMPLANT
SUT PROLENE BLUE 7 0 (SUTURE) ×8 IMPLANT
SUT PROLENE POLY MONO (SUTURE) ×4 IMPLANT
SUT SILK  1 MH (SUTURE) ×4
SUT SILK 1 MH (SUTURE) ×4 IMPLANT
SUT SILK 2 0 SH CR/8 (SUTURE) ×8 IMPLANT
SUT SILK 3 0 (SUTURE) ×2
SUT SILK 3 0 SH CR/8 (SUTURE) ×4 IMPLANT
SUT SILK 3-0 18XBRD TIE 12 (SUTURE) ×2 IMPLANT
SUT STEEL 6MS V (SUTURE) IMPLANT
SUT STEEL STERNAL CCS#1 18IN (SUTURE) IMPLANT
SUT STEEL SZ 6 DBL 3X14 BALL (SUTURE) IMPLANT
SUT VIC AB 1 CTX 18 (SUTURE) ×12 IMPLANT
SUT VIC AB 1 CTX 36 (SUTURE)
SUT VIC AB 1 CTX36XBRD ANBCTR (SUTURE) IMPLANT
SUT VIC AB 2-0 CT1 27 (SUTURE) ×6
SUT VIC AB 2-0 CT1 TAPERPNT 27 (SUTURE) ×6 IMPLANT
SUT VIC AB 2-0 CTX 27 (SUTURE) IMPLANT
SUT VIC AB 3-0 SH 27 (SUTURE) ×2
SUT VIC AB 3-0 SH 27X BRD (SUTURE) ×2 IMPLANT
SUT VIC AB 3-0 X1 27 (SUTURE) IMPLANT
SUTURE E-PAK OPEN HEART (SUTURE) ×4 IMPLANT
SYR 50ML SLIP (SYRINGE) IMPLANT
SYSTEM SAHARA CHEST DRAIN ATS (WOUND CARE) ×4 IMPLANT
TAPE CLOTH SURG 4X10 WHT LF (GAUZE/BANDAGES/DRESSINGS) ×8 IMPLANT
TOWEL OR 17X24 6PK STRL BLUE (TOWEL DISPOSABLE) ×4 IMPLANT
TOWEL OR 17X26 10 PK STRL BLUE (TOWEL DISPOSABLE) ×4 IMPLANT
TRAY FOLEY IC TEMP SENS 14FR (CATHETERS) ×4 IMPLANT
TUBING INSUFFLATION 10FT LAP (TUBING) ×4 IMPLANT
UNDERPAD 30X30 INCONTINENT (UNDERPADS AND DIAPERS) ×8 IMPLANT
WATER STERILE IRR 1000ML POUR (IV SOLUTION) ×8 IMPLANT

## 2012-01-10 NOTE — Progress Notes (Signed)
The patient was examined and preop studies reviewed. There has been no change from the prior exam and the patient is ready for surgery. Plan Redo CABG w/ L radial artery this am

## 2012-01-10 NOTE — Progress Notes (Signed)
[facility] BRIEF SICU PROGRESS NOTE  Day of Surgery  S/P Procedure(s) (LRB): REDO CORONARY ARTERY BYPASS GRAFTING (CABG) (N/A) RADIAL ARTERY HARVEST (Left)   Starting to wake on vent Stable hemodynamics Chest tube output low UOP excellent  Plan: Continue routine early postop  Jonathan Mason H 01/10/2012 8:42 PM

## 2012-01-10 NOTE — Progress Notes (Signed)
Patient has left for surgery, transported with monitor. IV Heparin D/C on call and am meds given as ordered.

## 2012-01-10 NOTE — Progress Notes (Signed)
  Echocardiogram Echocardiogram Transesophageal has been performed.  Margreta Journey 01/10/2012, 9:27 AM

## 2012-01-10 NOTE — Anesthesia Preprocedure Evaluation (Addendum)
Anesthesia Evaluation  Patient identified by MRN, date of birth, ID band Patient awake    Reviewed: Allergy & Precautions, H&P , NPO status , Patient's Chart, lab work & pertinent test results, reviewed documented beta blocker date and time   Airway Mallampati: II TM Distance: <3 FB     Dental No notable dental hx. (+) Teeth Intact and Dental Advidsory Given   Pulmonary neg pulmonary ROS,    Pulmonary exam normal       Cardiovascular + angina at rest + CAD and + Past MI negative cardio ROS  Rhythm:regular Rate:Normal     Neuro/Psych negative neurological ROS  negative psych ROS   GI/Hepatic negative GI ROS, Neg liver ROS,   Endo/Other  negative endocrine ROS  Renal/GU Renal diseasenegative Renal ROS  negative genitourinary   Musculoskeletal   Abdominal   Peds  Hematology negative hematology ROS (+)   Anesthesia Other Findings   Reproductive/Obstetrics negative OB ROS                          Anesthesia Physical Anesthesia Plan  ASA: IV  Anesthesia Plan: General   Post-op Pain Management:    Induction: Intravenous  Airway Management Planned: Oral ETT  Additional Equipment: Arterial line, CVP and PA Cath  Intra-op Plan:   Post-operative Plan: Post-operative intubation/ventilation  Informed Consent: I have reviewed the patients History and Physical, chart, labs and discussed the procedure including the risks, benefits and alternatives for the proposed anesthesia with the patient or authorized representative who has indicated his/her understanding and acceptance.   Dental Advisory Given  Plan Discussed with: Anesthesiologist, Surgeon and CRNA  Anesthesia Plan Comments:        Anesthesia Quick Evaluation

## 2012-01-10 NOTE — Transfer of Care (Signed)
Immediate Anesthesia Transfer of Care Note  Patient: Jonathan Mason  Procedure(s) Performed: Procedure(s) (LRB) with comments: REDO CORONARY ARTERY BYPASS GRAFTING (CABG) (N/A) RADIAL ARTERY HARVEST (Left)  Patient Location: SICU  Anesthesia Type: General  Level of Consciousness: sedated and unresponsive  Airway & Oxygen Therapy: Patient remains intubated per anesthesia plan and Patient placed on Ventilator (see vital sign flow sheet for setting)  Post-op Assessment: Report given to PACU RN and Post -op Vital signs reviewed and stable  Post vital signs: Reviewed and stable  Complications: No apparent anesthesia complications

## 2012-01-10 NOTE — Brief Op Note (Signed)
01/06/2012 - 01/10/2012  3:10 PM  PATIENT:  Jonathan Mason  36 y.o. male  PRE-OPERATIVE DIAGNOSIS:  History of CAD (s/p CABGx2 08')  POST-OPERATIVE DIAGNOSIS: .History of CAD (s/p CABGx2 08')  PROCEDURE:  REDO CORONARY ARTERY BYPASS GRAFTING (CABG) x 4 (LIMA to LAD, Left radial arterty  To Diagonal 1, SVG to distal Circumflex, and SVG to PDA) with left open radial artery harvest and EVH from the right thigh and lowe leg   SURGEON:  Surgeon(s) and Role:    * Kerin Perna, MD - Primary  PHYSICIAN ASSISTANT: Doree Fudge PA-C   ANESTHESIA:   general  EBL:  Total I/O In: 3000 [I.V.:3000] Out: 2200 [Urine:2200]  BLOOD ADMINISTERED:One  FFP and One pack of  PLTS  DRAINS:  Chest Tube(s) in the Mediastinal and Pleural spaces    COUNTS CORRECT:  YES  DICTATION: .Dragon Dictation  PLAN OF CARE: Admit to inpatient   PATIENT DISPOSITION:  ICU - intubated and hemodynamically stable.   Delay start of Pharmacological VTE agent (>24hrs) due to surgical blood loss or risk of bleeding: yes  PRE OP WEIGHT: 77.8 kg

## 2012-01-10 NOTE — Preoperative (Signed)
Beta Blockers   Reason not to administer Beta Blockers:Not Applicable 

## 2012-01-10 NOTE — Anesthesia Procedure Notes (Signed)
Procedure Name: Intubation Date/Time: 01/10/2012 7:59 AM Performed by: Carmela Rima Pre-anesthesia Checklist: Patient identified, Emergency Drugs available, Patient being monitored, Suction available and Timeout performed Patient Re-evaluated:Patient Re-evaluated prior to inductionOxygen Delivery Method: Circle system utilized Preoxygenation: Pre-oxygenation with 100% oxygen Intubation Type: IV induction Ventilation: Mask ventilation without difficulty Laryngoscope Size: Mac and 3 Grade View: Grade I Tube type: Oral Tube size: 7.5 mm Number of attempts: 1 Placement Confirmation: ETT inserted through vocal cords under direct vision,  positive ETCO2 and breath sounds checked- equal and bilateral Secured at: 22 cm Tube secured with: Tape Dental Injury: Teeth and Oropharynx as per pre-operative assessment

## 2012-01-10 NOTE — OR Nursing (Addendum)
1st call to SICU @1528  Volunteer desk notified of off pump to notify family @1528  2nd call @1615  On transport call 1639 In SICU notifed Roselee Culver RTR of incorrect needle count and to call with a report after chest xray also notified SICU nurses in the room Negative report Dr. Benard Rink

## 2012-01-10 NOTE — Anesthesia Postprocedure Evaluation (Signed)
  Anesthesia Post-op Note  Patient: Jonathan Mason  Procedure(s) Performed: Procedure(s) (LRB) with comments: REDO CORONARY ARTERY BYPASS GRAFTING (CABG) (N/A) RADIAL ARTERY HARVEST (Left)  Patient Location: ICU  Anesthesia Type: General  Level of Consciousness: sedated and unresponsive  Airway and Oxygen Therapy: Patient remains intubated per anesthesia plan  Post-op Pain: none  Post-op Assessment: Post-op Vital signs reviewed, Patient's Cardiovascular Status Stable and Respiratory Function Stable  Post-op Vital Signs: Reviewed and stable  Complications: No apparent anesthesia complications

## 2012-01-11 ENCOUNTER — Inpatient Hospital Stay (HOSPITAL_COMMUNITY): Payer: Medicaid Other

## 2012-01-11 DIAGNOSIS — I259 Chronic ischemic heart disease, unspecified: Secondary | ICD-10-CM | POA: Diagnosis present

## 2012-01-11 LAB — PREPARE PLATELET PHERESIS

## 2012-01-11 LAB — CBC
HCT: 33 % — ABNORMAL LOW (ref 39.0–52.0)
Hemoglobin: 11.2 g/dL — ABNORMAL LOW (ref 13.0–17.0)
MCH: 31.1 pg (ref 26.0–34.0)
MCV: 91.4 fL (ref 78.0–100.0)
Platelets: 160 10*3/uL (ref 150–400)
RDW: 12.2 % (ref 11.5–15.5)
WBC: 18.4 10*3/uL — ABNORMAL HIGH (ref 4.0–10.5)

## 2012-01-11 LAB — POCT I-STAT, CHEM 8
BUN: 10 mg/dL (ref 6–23)
Chloride: 98 mEq/L (ref 96–112)
Creatinine, Ser: 0.8 mg/dL (ref 0.50–1.35)
Potassium: 4.5 mEq/L (ref 3.5–5.1)
Sodium: 134 mEq/L — ABNORMAL LOW (ref 135–145)

## 2012-01-11 LAB — GLUCOSE, CAPILLARY
Glucose-Capillary: 110 mg/dL — ABNORMAL HIGH (ref 70–99)
Glucose-Capillary: 141 mg/dL — ABNORMAL HIGH (ref 70–99)
Glucose-Capillary: 170 mg/dL — ABNORMAL HIGH (ref 70–99)
Glucose-Capillary: 91 mg/dL (ref 70–99)
Glucose-Capillary: 98 mg/dL (ref 70–99)

## 2012-01-11 LAB — MAGNESIUM
Magnesium: 2.2 mg/dL (ref 1.5–2.5)
Magnesium: 2 mg/dL (ref 1.5–2.5)

## 2012-01-11 LAB — BASIC METABOLIC PANEL
BUN: 7 mg/dL (ref 6–23)
Calcium: 8 mg/dL — ABNORMAL LOW (ref 8.4–10.5)
Creatinine, Ser: 0.66 mg/dL (ref 0.50–1.35)
GFR calc Af Amer: >90 mL/min (ref >90)

## 2012-01-11 LAB — CREATININE, SERUM
GFR calc Af Amer: >90 mL/min (ref >90)
GFR calc non Af Amer: >90 mL/min (ref >90)

## 2012-01-11 LAB — PREPARE FRESH FROZEN PLASMA

## 2012-01-11 MED ORDER — FUROSEMIDE 10 MG/ML IJ SOLN
20.0000 mg | Freq: Two times a day (BID) | INTRAMUSCULAR | Status: DC
  Administered 2012-01-11 – 2012-01-13 (×5): 20 mg via INTRAVENOUS
  Filled 2012-01-11 (×4): qty 2

## 2012-01-11 MED ORDER — INSULIN GLARGINE 100 UNIT/ML ~~LOC~~ SOLN
15.0000 [IU] | Freq: Every day | SUBCUTANEOUS | Status: DC
  Administered 2012-01-11 – 2012-01-13 (×3): 15 [IU] via SUBCUTANEOUS

## 2012-01-11 MED ORDER — INSULIN ASPART 100 UNIT/ML ~~LOC~~ SOLN
0.0000 [IU] | SUBCUTANEOUS | Status: DC
  Administered 2012-01-11 (×2): 2 [IU] via SUBCUTANEOUS

## 2012-01-11 MED ORDER — TRAMADOL HCL 50 MG PO TABS
100.0000 mg | ORAL_TABLET | Freq: Four times a day (QID) | ORAL | Status: DC | PRN
  Administered 2012-01-11 – 2012-01-14 (×6): 100 mg via ORAL
  Filled 2012-01-11 (×7): qty 2

## 2012-01-11 MED ORDER — ISOSORBIDE MONONITRATE ER 30 MG PO TB24
30.0000 mg | ORAL_TABLET | Freq: Every day | ORAL | Status: DC
  Administered 2012-01-11 – 2012-01-15 (×5): 30 mg via ORAL
  Filled 2012-01-11 (×5): qty 1

## 2012-01-11 MED ORDER — INSULIN ASPART 100 UNIT/ML ~~LOC~~ SOLN: 0.0000 [IU] | SUBCUTANEOUS | Status: DC

## 2012-01-11 MED ORDER — INSULIN ASPART 100 UNIT/ML ~~LOC~~ SOLN
0.0000 [IU] | SUBCUTANEOUS | Status: DC
  Administered 2012-01-11: 4 [IU] via SUBCUTANEOUS
  Administered 2012-01-11: 2 [IU] via SUBCUTANEOUS
  Administered 2012-01-11: 4 [IU] via SUBCUTANEOUS
  Administered 2012-01-12: 2 [IU] via SUBCUTANEOUS
  Administered 2012-01-12: 4 [IU] via SUBCUTANEOUS
  Administered 2012-01-12 – 2012-01-13 (×3): 2 [IU] via SUBCUTANEOUS

## 2012-01-11 MED ORDER — POTASSIUM CHLORIDE CRYS ER 20 MEQ PO TBCR
20.0000 meq | EXTENDED_RELEASE_TABLET | Freq: Once | ORAL | Status: AC
  Administered 2012-01-11: 20 meq via ORAL
  Filled 2012-01-11: qty 1

## 2012-01-11 MED ORDER — KETOROLAC TROMETHAMINE 15 MG/ML IJ SOLN
15.0000 mg | Freq: Four times a day (QID) | INTRAMUSCULAR | Status: AC
  Administered 2012-01-11 – 2012-01-12 (×3): 15 mg via INTRAVENOUS
  Filled 2012-01-11 (×3): qty 1

## 2012-01-11 MED ORDER — OXYCODONE-ACETAMINOPHEN 5-325 MG PO TABS
2.0000 | ORAL_TABLET | ORAL | Status: DC | PRN
  Administered 2012-01-11 – 2012-01-15 (×22): 2 via ORAL
  Filled 2012-01-11 (×22): qty 2

## 2012-01-11 MED ORDER — VANCOMYCIN HCL IN DEXTROSE 1-5 GM/200ML-% IV SOLN
1000.0000 mg | INTRAVENOUS | Status: DC
  Administered 2012-01-11: 1000 mg via INTRAVENOUS
  Filled 2012-01-11 (×2): qty 200

## 2012-01-11 NOTE — Progress Notes (Addendum)
[facility] DAILY PROGRESS NOTE                   [address]            [place] [address]          [phone]      1 Day Post-Op Procedure(s) (LRB): REDO CORONARY ARTERY BYPASS GRAFTING (CABG) (N/A) RADIAL ARTERY HARVEST (Left)  Total Length of Stay:  LOS: 5 days   Subjective: Patient with incisional pain.  Objective: Vital signs in last 24 hours: Temp:  [98.6 F (37 C)-101.5 F (38.6 C)] 99.7 F (37.6 C) (09/17 0715) Pulse Rate:  [58-114] 98  (09/17 0715) Cardiac Rhythm:  [-] Normal sinus rhythm (09/17 0400) Resp:  [12-42] 34  (09/17 0715) BP: (85-119)/(52-75) 98/57 mmHg (09/17 0700) SpO2:  [74 %-100 %] 94 % (09/17 0715) Arterial Line BP: (90-130)/(44-69) 125/65 mmHg (09/17 0715) FiO2 (%):  [39.8 %-100 %] 39.8 % (09/16 2130) Weight:  [180 lb 12.4 oz (82 kg)] 180 lb 12.4 oz (82 kg) (09/17 0500)  Filed Weights   01/06/12 1620 01/10/12 0500 01/11/12 0500  Weight: 163 lb 2.3 oz (74 kg) 171 lb 8.3 oz (77.8 kg) 180 lb 12.4 oz (82 kg)    Weight change: 9 lb 4.2 oz (4.2 kg)   Hemodynamic parameters for last 24 hours: PAP: (29-55)/(14-27) 38/17 mmHg CO:  [4 L/min-6.7 L/min] 6.7 L/min CI:  [2.2 L/min/m2-3.7 L/min/m2] 3.7 L/min/m2  Intake/Output from previous day: 09/16 0701 - 09/17 0700 In: 7533.4 [P.O.:100; I.V.:5350.4; OZHYQ:6578; NG/GT:110; IV Piggyback:720] Out: 8405 [Urine:5935; Emesis/NG output:100; Blood:1900; Chest Tube:470]      Current Meds: Scheduled Meds:   . acetaminophen (TYLENOL) oral liquid 160 mg/5 mL  650 mg Per Tube NOW   Or  . acetaminophen  650 mg Rectal NOW  . acetaminophen  1,000 mg Oral Q6H   Or  . acetaminophen (TYLENOL) oral liquid 160 mg/5 mL  975 mg Per Tube Q6H  . aspirin EC  325 mg Oral Daily   Or  . aspirin  324 mg Per Tube Daily  . atorvastatin  80 mg Oral q1800  . bisacodyl  10 mg Oral Daily   Or  . bisacodyl  10 mg Rectal Daily  . budesonide  0.5 mg Nebulization BID  . cefUROXime (ZINACEF)  IV  1.5 g  Intravenous To OR  . cefUROXime (ZINACEF)  IV  1.5 g Intravenous Q12H  . Chlorhexidine Gluconate Cloth  6 each Topical Daily  . dexmedetomidine  0.1-0.7 mcg/kg/hr Intravenous To OR  . docusate sodium  200 mg Oral Daily  . insulin aspart  0-24 Units Subcutaneous Q2H   Followed by  . insulin aspart  0-24 Units Subcutaneous Q4H  . insulin (NOVOLIN-R) infusion   Intravenous To OR  . insulin regular  0-10 Units Intravenous TID WC  . isosorbide mononitrate  15 mg Oral Daily  . levalbuterol  1.25 mg Nebulization Q6H  . magnesium sulfate  4 g Intravenous Once  . metoprolol tartrate  12.5 mg Oral BID   Or  . metoprolol tartrate  12.5 mg Per Tube BID  . mupirocin ointment  1 application Nasal BID  . nitroglycerin-nicardipine-HEPARIN-sodium bicarbonate irrigation for artery spasm   Irrigation To OR  . nitroglycerin-nicardipine-HEPARIN-sodium bicarbonate irrigation for artery spasm   Irrigation To OR  . omega-3 acid ethyl esters  2 g Oral BID  . pantoprazole  40 mg Oral Q1200  . potassium chloride  10 mEq Intravenous Q1  Hr x 3  . sodium chloride  3 mL Intravenous Q12H  . tranexamic acid  15 mg/kg Intravenous To OR  . tranexamic acid (CYKLOKAPRON) infusion ([facility])  1.5 mg/kg/hr Intravenous To OR  . vancomycin  1,250 mg Intravenous To OR  . vancomycin  1,000 mg Intravenous Q24H   Continuous Infusions:   . sodium chloride Stopped (01/10/12 1719)  . sodium chloride    . sodium chloride 250 mL (01/11/12 0600)  . dexmedetomidine Stopped (01/11/12 0200)  . DOPamine 3 mcg/kg/min (01/10/12 2000)  . insulin (NOVOLIN-R) infusion Stopped (01/11/12 0400)  . lactated ringers 60 mL (01/10/12 1700)  . nitroGLYCERIN 3 mcg/min (01/10/12 2000)  . phenylephrine (NEO-SYNEPHRINE) Adult infusion Stopped (01/11/12 0500)   PRN Meds:.albumin human, lactated ringers, metoprolol, midazolam, morphine injection, morphine injection, ondansetron (ZOFRAN) IV, oxyCODONE, sodium chloride, DISCONTD: 0.9 % irrigation (POUR  BTL), DISCONTD: acetaminophen, DISCONTD: ALPRAZolam, DISCONTD: hemostatic agents, DISCONTD: hemostatic agents, DISCONTD:  HYDROmorphone (DILAUDID) injection, DISCONTD: nitroGLYCERIN, DISCONTD: ondansetron (ZOFRAN) IV, DISCONTD: oxyCODONE-acetaminophen DISCONTD: sodium chloride, DISCONTD: Surgifoam 1 Gm with 0.9% sodium chloride (4 ml) topical solution, DISCONTD: zolpidem  General appearance: alert and cooperative Neurologic: intact without focal deficits Heart: regular rate and rhythm Lungs: diminished breath sounds base - at bases Abdomen: soft, non-tender; bowel sounds normal; no masses,  no organomegaly Extremities: edema mild RLE edema;SCD on LLE Wound: Dressings are clean and dry  Lab Results: CBC: Basename 01/11/12 0406 01/10/12 2239 01/10/12 2230  WBC 14.6* -- 17.6*  HGB 10.8* 11.6* --  HCT 31.7* 34.0* --  PLT 160 -- 168   BMET:  Basename 01/11/12 0406 01/10/12 2239  NA 137 138  K 4.1 4.2  CL 102 101  CO2 28 --  GLUCOSE 101* 130*  BUN 7 6  CREATININE 0.66 0.80  CALCIUM 8.0* --    PT/INR:  Basename 01/10/12 1658  LABPROT 16.4*  INR 1.30   Radiology: Dg Chest 2 View  01/09/2012  *RADIOLOGY REPORT*  Clinical Data: Preoperative respiratory exam for repeat CABG.  CHEST - 2 VIEW  Comparison: 01/06/2012  Findings: There has been previous median sternotomy and CABG. Heart size is normal.  Mediastinal shadows are normal.  The lungs are clear.  The vascularity is normal.  No effusions.  No bony abnormalities.  IMPRESSION: Previous CABG.  No active disease radiographically.   Original Report Authenticated By: Thomasenia Sales, M.D.    Dg Chest Portable 1 View  01/10/2012  *RADIOLOGY REPORT*  Clinical Data: Status post CABG  PORTABLE CHEST - 1 VIEW  Comparison: 01/09/2012  Findings: Patient is status post CABG.  ET tube, Swan-Ganz catheter, left chest tube and nasogastric tube all in good position.  Mild vascular congestion.  No pneumothorax.  No visible surgical instrument or  radiopaque needle is observed.  IMPRESSION: Satisfactory appearance status post CABG.  There is no visible needle-like structure overlying the chest.  Tubes and lines good position.  Mild vascular congestion.   Original Report Authenticated By: Elsie Stain, M.D.      Assessment/Plan: S/P Procedure(s) (LRB): REDO CORONARY ARTERY BYPASS GRAFTING (CABG) (N/A) RADIAL ARTERY HARVEST (Left) 1.CV-SR. Wean Dopamine.On Nitro for radial artery-wean off.On Lopressor 12.5 bid and Imdur 15 daily. 2.Pulmonary-Encourage incentive spirometer and flutter valve.CXR this am shows low lung volumes, vascular congestion, no ptx, air bubble in stomach. 3.Volume overload 4.ABL anemia- H and H 10.8 and 31.7 this am. 5.HGA1C 5.8. CBGs 91/128/126.Will need further surveillance as an outpatient. 6.May need Toradol for better pain control 7.See progression orders  Donielle Zimmerma PA-C 01/11/2012 7:27 AM  patient examined and medical record reviewed,agree with above note  Add Imdur for radial art graft and the stop IV NTG VAN TRIGT III,Hadyn Azer 01/11/2012

## 2012-01-11 NOTE — Addendum Note (Signed)
Addendum  created 01/11/12 0851 by Adair Laundry, CRNA   Modules edited:Anesthesia Events

## 2012-01-11 NOTE — Op Note (Signed)
NAMEMarland Kitchen  Jonathan Mason, Jonathan Mason NO.:  [id number]  MEDICAL RECORD NO.:  [id number]  LOCATION:  2305                         FACILITY:  [facility]  PHYSICIAN:  Kerin Perna, M.D.  DATE OF BIRTH:  April 22, 1976  DATE OF PROCEDURE:  01/10/2012 DATE OF DISCHARGE:                              OPERATIVE REPORT   OPERATION: 1. Redo sternotomy, redo coronary artery bypass grafting x4 (left     internal mammary artery to left anterior descending, left radial     artery free graft to diagonal, saphenous vein graft to circumflex     marginal, saphenous vein graft to posterior descending). 2. Endoscopic harvest of right leg greater saphenous vein.  SURGEON:  Kerin Perna, M.D.  ASSISTANT:  Doree Fudge, PA-C.  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  POSTOPERATIVE DIAGNOSIS:  Severe recurrent coronary artery disease with unstable angina, subendocardial myocardial infarction.  ANESTHESIA:  General.  INDICATIONS:  The patient is a 36 year old Caucasian male smoker with history of substance abuse, who presented to the hospital with unstable angina, positive cardiac enzymes.  Cardiac catheterization demonstrated occlusion of a previously placed bypass grafts to the posterior descending and circumflex marginal with global LV dysfunction, and high- grade LAD diagonal stenosis.  Surgical revascularization was recommended.  I examined the patient in the CCU and reviewed the results of the cardiac catheterization with the patient and his family.  I discussed indications and expected benefits of redo bypass surgery.  I discussed the plan to use arterial grafts using left IMA and left radial artery due to his young age.  I reviewed the particular aspects of redo surgery including the increased risk, the increased duration of procedure, and the expected recovery.  I discussed with the patient risks  to him of the operation including risks of MI, stroke, bleeding, infection, and death.  He understood and agreed to proceed with the surgery under what I felt was an informed consent.  OPERATIVE FINDINGS: 1. Severely diseased targets. 2. Adequate vein conduit, arterial conduit somewhat small, but with     adequate flow. 3. Global LV dysfunction slightly improved at the termination of the     procedure.  PROCEDURE:  The patient was brought to the operating room and placed supine on the operating table where general anesthesia was induced.  A transesophageal 2D echo probe was placed by the Anesthesia team.  The patient was prepped and draped as a sterile field.  A proper time-out was performed.  The left arm was prepped and draped as well.  A left forearm incision was made and the radial artery was harvested from the left forearm using the Harmonic Scalpel.  Before removing the vessel, a patent palmar arch was documented by sterile Doppler showing a good signal with occlusion of the radial artery.  The artery was flushed with a papaverine type solution and the forearm incision closed with Vicryl and then skin staples and the arm was tucked to the side.  A redo sternal incision was then made as the vein was harvested endoscopically from the right leg.  An oscillating saw was used to re- enter the sternum.  This was  performed without injury to the underlying vascular structures.  The sternum was gently retracted and the anterior mediastinal adhesions were dissected.  Next, the rule tract was placed and the left internal mammary artery was harvested as a pedicle graft from its origin at the subclavian vessels. It was somewhat small, but had adequate flow.  The sternal retractor was placed and further dissection was then performed to identify the ascending aorta, right atrium, right ventricle, and the inferior pericardial recess.  Once dissection was completed and the previously placed  vein had been identified and was noted to be thrombosed, the patient was heparinized.  Pursestrings were placed in the ascending aorta and right atrium, and the patient was placed on cardiopulmonary bypass.  The vein and radial artery and mammary artery were prepared for the distal anastomoses and cardioplegia cannulas were placed.  The patient was cooled to 32 degrees and the aortic crossclamp was applied.  1 L of cold blood cardioplegia was delivered in split doses between the antegrade aortic and retrograde coronary sinus catheters.  There was good cardioplegic arrest. Cardioplegia was delivered every 20 minutes or less.  The remainder of the posterior dissection was then completed.  The vein previously placed was noted to be a sequential vein graft from the aorta to the proximal posterior descending, then around to the circumflex. This was thrombosed. The first distal anastomosis was to the posterior descending distal to the previously placed bypass anastomosis.  Here the vessel was 1.4 mm and a reverse saphenous vein was sewn end-to-side with running 7-0 Prolene with good flow through the graft.  The second distal anastomosis was the obtuse marginal branch distal to the previous anastomosis.  Here the vessel was 1.4-mm vessel and a reverse saphenous vein was sewn end- to-side with running 7-0 Prolene with good flow through the graft. Cardioplegia was redosed.  The third distal anastomosis was to the large diagonal branch to LAD.  Had a proximal 80% stenosis.  The radial artery was sewn end-to-side with running 8-0 Prolene and there was good flow through the graft.  The fourth distal anastomosis was to the mid portion of the LAD.  It had a proximal 80% stenosis and the left IMA pedicle was brought through an opening and the left lateral pericardium was brought down onto the LAD and sewn end-to-side with running 8-0 Prolene.  There was good flow through the anastomosis after briefly  releasing the pedicle bulldog on the mammary pedicle.  The bulldog was reapplied and the pedicle was secured to the epicardium.  Cardioplegia was redosed.  While the crossclamp was still in place, two proximal vein anastomoses were performed on the ascending aorta.  The ascending aorta was very short.  A 4.0 mm punch running 7-0 Prolene was used and air was evacuated from the coronaries with a dose of retrograde warm blood cardioplegia as the crossclamp was removed.  The heart was cardioverted back to a regular rhythm.  The grafts were opened and perfused.  At this point, the radial artery was then sewn to the hood of the vein graft to the circumflex using running 7-0 Prolene. The bulldogs were then released and there was good flow through the Y graft as well.  The proximal distal anastomoses were checked and found to be hemostatic. Temporary pacing wires were applied.  The patient was rewarmed.  Low- dose dopamine was started and the ventilator was resumed after the lungs were re-expanded.  The patient was weaned off bypass without difficulty. The transesophageal  echo showed improved LV function.  Protamine was administered without adverse reaction.  There was continued coagulopathic bleeding and the patient was given platelets and FFP with improved coagulation function.  The superior mediastinal fat was closed over the aorta.  An anterior mediastinal and left pleural chest tube were placed and brought out through separate incisions.  The sternum was closed with interrupted steel wire.  The pectoralis fascia was closed with interrupted #1 Vicryl.  The subcutaneous and skin layers were closed in running Vicryl and sterile dressings were applied.  Total cardiopulmonary bypass time was 150 minutes.     Kerin Perna, M.D.     PV/MEDQ  D:  01/10/2012  T:  01/11/2012  Job:  [id number]  cc:   Pamella Pert, MD

## 2012-01-11 NOTE — Progress Notes (Signed)
Patient ID: Jonathan Mason, male   DOB: [birth year], 36 y.o.   MRN: [id number]                   [address]            [place] [address]          [phone]     1 Day Post-Op Procedure(s) (LRB): REDO CORONARY ARTERY BYPASS GRAFTING (CABG) (N/A) RADIAL ARTERY HARVEST (Left)  Total Length of Stay:  LOS: 5 days  BP 122/84  Pulse 104  Temp 98.4 F (36.9 C) (Oral)  Resp 33  Ht 5\' 3"  (1.6 m)  Wt 180 lb 12.4 oz (82 kg)  BMI 32.02 kg/m2  SpO2 97%     . sodium chloride Stopped (01/10/12 1719)  . sodium chloride 20 mL/hr at 01/11/12 1658  . sodium chloride 250 mL (01/11/12 0600)  . DISCONTD: dexmedetomidine Stopped (01/11/12 0200)  . DISCONTD: DOPamine Stopped (01/11/12 1200)  . DISCONTD: insulin (NOVOLIN-R) infusion Stopped (01/11/12 0400)  . DISCONTD: lactated ringers 60 mL (01/10/12 1700)  . DISCONTD: nitroGLYCERIN 3 mcg/min (01/10/12 2000)  . DISCONTD: phenylephrine (NEO-SYNEPHRINE) Adult infusion Stopped (01/11/12 0500)     Lab Results  Component Value Date   WBC 14.6* 01/11/2012   HGB 10.8* 01/11/2012   HCT 31.7* 01/11/2012   PLT 160 01/11/2012   GLUCOSE 101* 01/11/2012   CHOL 228* 01/06/2012   TRIG 521* 01/06/2012   HDL 29* 01/06/2012   LDLCALC UNABLE TO CALCULATE IF TRIGLYCERIDE OVER 400 mg/dL 0/86/5784   ALT 25 6/96/2952   AST 25 01/06/2012   NA 137 01/11/2012   K 4.1 01/11/2012   CL 102 01/11/2012   CREATININE 0.66 01/11/2012   BUN 7 01/11/2012   CO2 28 01/11/2012   TSH 1.753 01/07/2012   INR 1.30 01/10/2012   HGBA1C 5.8* 01/07/2012   Stable today,   Delight Ovens MD  Beeper [phone] Office [phone] 01/11/2012 5:08 PM

## 2012-01-11 NOTE — Procedures (Signed)
Extubation Procedure Note  Patient Details:   Name: Jonathan Mason DOB: 1975-10-05 MRN: [id number]   Airway Documentation: Patient extubated to 4 lpm nasal cannula.  VC 750 ml, NIF -40, patient able to hold head off bed 10 seconds.  Patient able to breathe around deflated cuff and vocalize post procedure.  Tolerated well with no complications.    Evaluation  O2 sats: stable throughout Complications: No apparent complications Patient did tolerate procedure well. Bilateral Breath Sounds: Clear   Yes  Machael Raine, Aloha Gell 01/11/2012, 2:55 AM

## 2012-01-12 ENCOUNTER — Inpatient Hospital Stay (HOSPITAL_COMMUNITY): Payer: Medicaid Other

## 2012-01-12 ENCOUNTER — Encounter (HOSPITAL_COMMUNITY): Payer: Self-pay | Admitting: Cardiothoracic Surgery

## 2012-01-12 LAB — TYPE AND SCREEN
ABO/RH(D): O POS
Antibody Screen: NEGATIVE
Unit division: 0
Unit division: 0

## 2012-01-12 LAB — BASIC METABOLIC PANEL
Chloride: 97 mEq/L (ref 96–112)
GFR calc Af Amer: >90 mL/min (ref >90)
GFR calc non Af Amer: >90 mL/min (ref >90)
Potassium: 4.2 mEq/L (ref 3.5–5.1)
Sodium: 136 mEq/L (ref 135–145)

## 2012-01-12 LAB — GLUCOSE, CAPILLARY
Glucose-Capillary: 124 mg/dL — ABNORMAL HIGH (ref 70–99)
Glucose-Capillary: 160 mg/dL — ABNORMAL HIGH (ref 70–99)
Glucose-Capillary: 188 mg/dL — ABNORMAL HIGH (ref 70–99)

## 2012-01-12 LAB — CBC
Hemoglobin: 10.1 g/dL — ABNORMAL LOW (ref 13.0–17.0)
RBC: 3.29 MIL/uL — ABNORMAL LOW (ref 4.22–5.81)

## 2012-01-12 MED ORDER — VANCOMYCIN HCL IN DEXTROSE 1-5 GM/200ML-% IV SOLN
1000.0000 mg | Freq: Two times a day (BID) | INTRAVENOUS | Status: AC
  Administered 2012-01-12 (×2): 1000 mg via INTRAVENOUS
  Filled 2012-01-12 (×2): qty 200

## 2012-01-12 MED ORDER — FUROSEMIDE 10 MG/ML IJ SOLN
40.0000 mg | Freq: Once | INTRAMUSCULAR | Status: AC
  Administered 2012-01-12: 40 mg via INTRAVENOUS
  Filled 2012-01-12: qty 4

## 2012-01-12 MED ORDER — DM-GUAIFENESIN ER 30-600 MG PO TB12
1.0000 | ORAL_TABLET | Freq: Two times a day (BID) | ORAL | Status: DC | PRN
  Administered 2012-01-12: 1 via ORAL
  Filled 2012-01-12 (×2): qty 1

## 2012-01-12 MED ORDER — AMIODARONE HCL 200 MG PO TABS
400.0000 mg | ORAL_TABLET | Freq: Two times a day (BID) | ORAL | Status: DC
  Administered 2012-01-12 – 2012-01-15 (×7): 400 mg via ORAL
  Filled 2012-01-12 (×8): qty 2

## 2012-01-12 MED ORDER — METOPROLOL TARTRATE 25 MG PO TABS
25.0000 mg | ORAL_TABLET | Freq: Two times a day (BID) | ORAL | Status: DC
  Administered 2012-01-12 – 2012-01-15 (×7): 25 mg via ORAL
  Filled 2012-01-12 (×8): qty 1

## 2012-01-12 NOTE — Significant Event (Signed)
1645pm-pt and his spouse requested bottle of Endocet be picked up from pharmacy. Staff informed patient and his wife that bottle of Endocet cannot be brought back to patient's room once picked up from phamacy. Pt and his spouse agreed and verbalized understanding. Patient's spouse stated will put med put in car. Will monitor patient closely. Breeze Angell, Charity fundraiser.
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
## 2012-01-12 NOTE — Progress Notes (Addendum)
TCTS DAILY PROGRESS NOTE                   [address]            [place] [address]          [phone]      2 Days Post-Op Procedure(s) (LRB): REDO CORONARY ARTERY BYPASS GRAFTING (CABG) (N/A) RADIAL ARTERY HARVEST (Left)  Total Length of Stay:  LOS: 6 days   Subjective: Patient with incisional pain, but somewhat better controlled this am.  Objective: Vital signs in last 24 hours: Temp:  [98.4 F (36.9 C)-99.4 F (37.4 C)] 99.4 F (37.4 C) (09/18 0734) Pulse Rate:  [94-111] 101  (09/18 0700) Cardiac Rhythm:  [-] Sinus tachycardia (09/17 2000) Resp:  [21-37] 24  (09/18 0700) BP: (106-133)/(65-91) 125/84 mmHg (09/18 0700) SpO2:  [89 %-99 %] 96 % (09/18 0746) Arterial Line BP: (144-148)/(72-82) 148/82 mmHg (09/17 1100) Weight:  [178 lb 12.7 oz (81.1 kg)] 178 lb 12.7 oz (81.1 kg) (09/18 0457)  Filed Weights   01/10/12 0500 01/11/12 0500 01/12/12 0457  Weight: 171 lb 8.3 oz (77.8 kg) 180 lb 12.4 oz (82 kg) 178 lb 12.7 oz (81.1 kg)    Weight change: -1 lb 15.8 oz (-0.9 kg)   Hemodynamic parameters for last 24 hours: PAP: (44)/(18) 44/18 mmHg  Intake/Output from previous day: 09/17 0701 - 09/18 0700 In: 1148 [P.O.:480; I.V.:364; IV Piggyback:304] Out: 3595 [Urine:3325; Chest Tube:270]    Current Meds: Scheduled Meds:   . acetaminophen (TYLENOL) oral liquid 160 mg/5 mL  650 mg Per Tube NOW   Or  . acetaminophen  650 mg Rectal NOW  . acetaminophen  1,000 mg Oral Q6H   Or  . acetaminophen (TYLENOL) oral liquid 160 mg/5 mL  975 mg Per Tube Q6H  . aspirin EC  325 mg Oral Daily   Or  . aspirin  324 mg Per Tube Daily  . atorvastatin  80 mg Oral q1800  . bisacodyl  10 mg Oral Daily   Or  . bisacodyl  10 mg Rectal Daily  . budesonide  0.5 mg Nebulization BID  . cefUROXime (ZINACEF)  IV  1.5 g Intravenous To OR  . cefUROXime (ZINACEF)  IV  1.5 g Intravenous Q12H  . Chlorhexidine Gluconate Cloth  6 each Topical Daily  . dexmedetomidine  0.1-0.7  mcg/kg/hr Intravenous To OR  . docusate sodium  200 mg Oral Daily  . insulin aspart  0-24 Units Subcutaneous Q2H   Followed by  . insulin aspart  0-24 Units Subcutaneous Q4H  . insulin (NOVOLIN-R) infusion   Intravenous To OR  . insulin regular  0-10 Units Intravenous TID WC  . isosorbide mononitrate  15 mg Oral Daily  . levalbuterol  1.25 mg Nebulization Q6H  . magnesium sulfate  4 g Intravenous Once  . metoprolol tartrate  12.5 mg Oral BID   Or  . metoprolol tartrate  12.5 mg Per Tube BID  . mupirocin ointment  1 application Nasal BID  . nitroglycerin-nicardipine-HEPARIN-sodium bicarbonate irrigation for artery spasm   Irrigation To OR  . nitroglycerin-nicardipine-HEPARIN-sodium bicarbonate irrigation for artery spasm   Irrigation To OR  . omega-3 acid ethyl esters  2 g Oral BID  . pantoprazole  40 mg Oral Q1200  . potassium chloride  10 mEq Intravenous Q1 Hr x 3  . sodium chloride  3 mL Intravenous Q12H  . tranexamic acid  15 mg/kg Intravenous To OR  . tranexamic acid (  CYKLOKAPRON) infusion ([facility])  1.5 mg/kg/hr Intravenous To OR  . vancomycin  1,250 mg Intravenous To OR  . vancomycin  1,000 mg Intravenous Q24H   Continuous Infusions:   . sodium chloride Stopped (01/10/12 1719)  . sodium chloride    . sodium chloride 250 mL (01/11/12 0600)  . dexmedetomidine Stopped (01/11/12 0200)  . DOPamine 3 mcg/kg/min (01/10/12 2000)  . insulin (NOVOLIN-R) infusion Stopped (01/11/12 0400)  . lactated ringers 60 mL (01/10/12 1700)  . nitroGLYCERIN 3 mcg/min (01/10/12 2000)  . phenylephrine (NEO-SYNEPHRINE) Adult infusion Stopped (01/11/12 0500)   PRN Meds:.albumin human, metoprolol, midazolam, morphine injection, ondansetron (ZOFRAN) IV, oxyCODONE-acetaminophen, sodium chloride, traMADol, DISCONTD: oxyCODONE  General appearance: alert and cooperative Neurologic: intact without focal deficits Heart: Slightly tachycardic Lungs: diminished breath sounds base - at bases Abdomen: soft,  non-tender; bowel sounds normal; no masses,  no organomegaly Extremities: edema mild RLE edema;SCD on LLE Wound: Dressings are clean and dry  Lab Results: CBC:  Basename 01/12/12 0349 01/11/12 1751 01/11/12 1746  WBC 15.5* -- 18.4*  HGB 10.1* 11.9* --  HCT 30.3* 35.0* --  PLT 155 -- 179   BMET:   Basename 01/12/12 0349 01/11/12 1751 01/11/12 0406  NA 136 134* --  K 4.2 4.5 --  CL 97 98 --  CO2 32 -- 28  GLUCOSE 126* 164* --  BUN 12 10 --  CREATININE 0.66 0.80 --  CALCIUM 8.6 -- 8.0*    PT/INR:   Basename 01/10/12 1658  LABPROT 16.4*  INR 1.30   Radiology: Dg Chest Portable 1 View In Am  01/12/2012  *RADIOLOGY REPORT*  Clinical Data: Postop CABG  PORTABLE CHEST - 1 VIEW  Comparison: Portable chest x-ray of 01/11/2012  Findings: Aeration has improved slightly.  The Swan-Ganz catheter has been removed and a venous sheath remains in the SVC.  A left chest tube is present.  No pneumothorax is noted.  Cardiomegaly is stable, and mild basilar atelectasis remain  IMPRESSION: Slightly better aeration.  Swan-Ganz catheter removed.  No pneumothorax with left chest tube remaining.   Original Report Authenticated By: Juline Patch, M.D.    Dg Chest Portable 1 View In Am  01/11/2012  *RADIOLOGY REPORT*  Clinical Data: Bypass surgery.  PORTABLE CHEST - 1 VIEW  Comparison: 01/10/2012.  Findings: The endotracheal tube and NG tubes have been removed. The right IJ Swan-Ganz catheter and left-sided chest tubes are stable.  Low lung volumes with vascular crowding and basilar atelectasis.  Probable mild vascular congestion but no overt pulmonary edema and no pleural effusions or pneumothorax.  IMPRESSION:  1.  Removal of ET and NG tubes. 2.  Remaining support apparatus is stable. 3.  Low lung volumes with vascular crowding and streaky atelectasis.   Original Report Authenticated By: P. Loralie Champagne, M.D.    Dg Chest Portable 1 View  01/10/2012  *RADIOLOGY REPORT*  Clinical Data: Status post CABG   PORTABLE CHEST - 1 VIEW  Comparison: 01/09/2012  Findings: Patient is status post CABG.  ET tube, Swan-Ganz catheter, left chest tube and nasogastric tube all in good position.  Mild vascular congestion.  No pneumothorax.  No visible surgical instrument or radiopaque needle is observed.  IMPRESSION: Satisfactory appearance status post CABG.  There is no visible needle-like structure overlying the chest.  Tubes and lines good position.  Mild vascular congestion.   Original Report Authenticated By: Elsie Stain, M.D.      Assessment/Plan: S/P Procedure(s) (LRB): REDO CORONARY ARTERY BYPASS GRAFTING (CABG) (N/A)  RADIAL ARTERY HARVEST (Left) 1.CV-SR/ST.Will increase Lopressor to 25 bid and continue Imdur 30 daily. 2.Pulmonary-Chest tube with 260 cc of output.Probable remove later today or in am.Encourage incentive spirometer and flutter valve.CXR this am shows low lung volumes, and no ptx  3.Mild volume overload 4.ABL anemia- H and H 10.1 and 30.3 this am. 5.HGA1C 5.8. CBGs 141/124/160.Continue insulin PRN.Will need further surveillance as an outpatient. 6.Transfer to PCTU once chest tubes out.   Donielle Zimmerma PA-C 01/12/2012 8:21 AM   Tachy rhythm- inc lopressor and start amiodarone po

## 2012-01-12 NOTE — Progress Notes (Signed)
Patient examined and record reviewed.Hemodynamics stable,labs satisfactory.Patient had stable day.Continue current care.  Maintaining sinus rhythm oxygenation adequate Diminished breath sounds on left side Diuresis with IV Lasix Blood sugars adequately controlled VAN TRIGT III,PETER 01/12/2012

## 2012-01-13 ENCOUNTER — Inpatient Hospital Stay (HOSPITAL_COMMUNITY): Payer: Medicaid Other

## 2012-01-13 LAB — BASIC METABOLIC PANEL
BUN: 19 mg/dL (ref 6–23)
CO2: 32 mEq/L (ref 19–32)
Calcium: 9.1 mg/dL (ref 8.4–10.5)
Chloride: 93 mEq/L — ABNORMAL LOW (ref 96–112)
Creatinine, Ser: 0.69 mg/dL (ref 0.50–1.35)
GFR calc Af Amer: >90 mL/min (ref >90)
GFR calc non Af Amer: >90 mL/min (ref >90)
Glucose, Bld: 109 mg/dL — ABNORMAL HIGH (ref 70–99)
Potassium: 3.9 mEq/L (ref 3.5–5.1)
Sodium: 135 mEq/L (ref 135–145)

## 2012-01-13 LAB — CBC
HCT: 29.9 % — ABNORMAL LOW (ref 39.0–52.0)
Hemoglobin: 10.1 g/dL — ABNORMAL LOW (ref 13.0–17.0)
MCH: 31.2 pg (ref 26.0–34.0)
MCHC: 33.8 g/dL (ref 30.0–36.0)
MCV: 92.3 fL (ref 78.0–100.0)
Platelets: 202 10*3/uL (ref 150–400)
RBC: 3.24 MIL/uL — ABNORMAL LOW (ref 4.22–5.81)
RDW: 12.1 % (ref 11.5–15.5)
WBC: 17.7 10*3/uL — ABNORMAL HIGH (ref 4.0–10.5)

## 2012-01-13 LAB — CULTURE, BLOOD (SINGLE): Culture: NO GROWTH

## 2012-01-13 LAB — GLUCOSE, CAPILLARY
Glucose-Capillary: 105 mg/dL — ABNORMAL HIGH (ref 70–99)
Glucose-Capillary: 115 mg/dL — ABNORMAL HIGH (ref 70–99)
Glucose-Capillary: 120 mg/dL — ABNORMAL HIGH (ref 70–99)
Glucose-Capillary: 136 mg/dL — ABNORMAL HIGH (ref 70–99)
Glucose-Capillary: 140 mg/dL — ABNORMAL HIGH (ref 70–99)
Glucose-Capillary: 148 mg/dL — ABNORMAL HIGH (ref 70–99)

## 2012-01-13 MED ORDER — FUROSEMIDE 10 MG/ML IJ SOLN
INTRAMUSCULAR | Status: AC
  Filled 2012-01-13: qty 4

## 2012-01-13 MED ORDER — POTASSIUM CHLORIDE CRYS ER 20 MEQ PO TBCR
20.0000 meq | EXTENDED_RELEASE_TABLET | Freq: Once | ORAL | Status: AC
  Administered 2012-01-13: 20 meq via ORAL
  Filled 2012-01-13: qty 1

## 2012-01-13 MED ORDER — INSULIN ASPART 100 UNIT/ML ~~LOC~~ SOLN
0.0000 [IU] | Freq: Three times a day (TID) | SUBCUTANEOUS | Status: DC
  Administered 2012-01-13 – 2012-01-14 (×4): 2 [IU] via SUBCUTANEOUS

## 2012-01-13 MED FILL — Heparin Sodium (Porcine) Inj 1000 Unit/ML: INTRAMUSCULAR | Qty: 10 | Status: AC

## 2012-01-13 MED FILL — Sodium Chloride Irrigation Soln 0.9%: Qty: 3000 | Status: AC

## 2012-01-13 MED FILL — Lidocaine HCl IV Inj 20 MG/ML: INTRAVENOUS | Qty: 5 | Status: AC

## 2012-01-13 MED FILL — Sodium Chloride IV Soln 0.9%: INTRAVENOUS | Qty: 1000 | Status: AC

## 2012-01-13 MED FILL — Mannitol IV Soln 20%: INTRAVENOUS | Qty: 500 | Status: AC

## 2012-01-13 MED FILL — Heparin Sodium (Porcine) Inj 1000 Unit/ML: INTRAMUSCULAR | Qty: 30 | Status: AC

## 2012-01-13 MED FILL — Electrolyte-R (PH 7.4) Solution: INTRAVENOUS | Qty: 5000 | Status: AC

## 2012-01-13 MED FILL — Sodium Bicarbonate IV Soln 8.4%: INTRAVENOUS | Qty: 50 | Status: AC

## 2012-01-13 NOTE — Progress Notes (Signed)
TCTS DAILY PROGRESS NOTE                   [address]            [place] [address]          [phone]      3 Days Post-Op Procedure(s) (LRB): REDO CORONARY ARTERY BYPASS GRAFTING (CABG) (N/A) RADIAL ARTERY HARVEST (Left)  Total Length of Stay:  LOS: 7 days   Subjective: Patient feeling a little better today. Only complaints are incisional pain and not much appetite. He denies abdominal pain, N/V.Had a bowel movement.   Objective: Vital signs in last 24 hours: Temp:  [97.9 F (36.6 C)-99.9 F (37.7 C)] 98.5 F (36.9 C) (09/19 0000) Pulse Rate:  [86-132] 99  (09/19 0700) Cardiac Rhythm:  [-] Sinus tachycardia (09/19 0400) Resp:  [14-26] 23  (09/19 0700) BP: (100-122)/(62-88) 111/78 mmHg (09/19 0700) SpO2:  [93 %-100 %] 96 % (09/19 0700) Weight:  [172 lb 14.4 oz (78.427 kg)] 172 lb 14.4 oz (78.427 kg) (09/19 0600)  Filed Weights   01/11/12 0500 01/12/12 0457 01/13/12 0600  Weight: 180 lb 12.4 oz (82 kg) 178 lb 12.7 oz (81.1 kg) 172 lb 14.4 oz (78.427 kg)    Weight change: -5 lb 14.3 oz (-2.673 kg)       Intake/Output from previous day: 09/18 0701 - 09/19 0700 In: 1401 [P.O.:480; I.V.:460; IV Piggyback:458] Out: 2130 [Urine:2090; Chest Tube:40]    Current Meds: Scheduled Meds:   . acetaminophen (TYLENOL) oral liquid 160 mg/5 mL  650 mg Per Tube NOW   Or  . acetaminophen  650 mg Rectal NOW  . acetaminophen  1,000 mg Oral Q6H   Or  . acetaminophen (TYLENOL) oral liquid 160 mg/5 mL  975 mg Per Tube Q6H  . aspirin EC  325 mg Oral Daily   Or  . aspirin  324 mg Per Tube Daily  . atorvastatin  80 mg Oral q1800  . bisacodyl  10 mg Oral Daily   Or  . bisacodyl  10 mg Rectal Daily  . budesonide  0.5 mg Nebulization BID  . cefUROXime (ZINACEF)  IV  1.5 g Intravenous To OR  . cefUROXime (ZINACEF)  IV  1.5 g Intravenous Q12H  . Chlorhexidine Gluconate Cloth  6 each Topical Daily  . dexmedetomidine  0.1-0.7 mcg/kg/hr Intravenous To OR  .  docusate sodium  200 mg Oral Daily  . insulin aspart  0-24 Units Subcutaneous Q2H   Followed by  . insulin aspart  0-24 Units Subcutaneous Q4H  . insulin (NOVOLIN-R) infusion   Intravenous To OR  . insulin regular  0-10 Units Intravenous TID WC  . isosorbide mononitrate  15 mg Oral Daily  . levalbuterol  1.25 mg Nebulization Q6H  . magnesium sulfate  4 g Intravenous Once  . metoprolol tartrate  12.5 mg Oral BID   Or  . metoprolol tartrate  12.5 mg Per Tube BID  . mupirocin ointment  1 application Nasal BID  . nitroglycerin-nicardipine-HEPARIN-sodium bicarbonate irrigation for artery spasm   Irrigation To OR  . nitroglycerin-nicardipine-HEPARIN-sodium bicarbonate irrigation for artery spasm   Irrigation To OR  . omega-3 acid ethyl esters  2 g Oral BID  . pantoprazole  40 mg Oral Q1200  . potassium chloride  10 mEq Intravenous Q1 Hr x 3  . sodium chloride  3 mL Intravenous Q12H  . tranexamic acid  15 mg/kg Intravenous To OR  . tranexamic acid (CYKLOKAPRON)  infusion ([facility])  1.5 mg/kg/hr Intravenous To OR  . vancomycin  1,250 mg Intravenous To OR  . vancomycin  1,000 mg Intravenous Q24H   Continuous Infusions:   . sodium chloride Stopped (01/10/12 1719)  . sodium chloride    . sodium chloride 250 mL (01/11/12 0600)  . dexmedetomidine Stopped (01/11/12 0200)  . DOPamine 3 mcg/kg/min (01/10/12 2000)  . insulin (NOVOLIN-R) infusion Stopped (01/11/12 0400)  . lactated ringers 60 mL (01/10/12 1700)  . nitroGLYCERIN 3 mcg/min (01/10/12 2000)  . phenylephrine (NEO-SYNEPHRINE) Adult infusion Stopped (01/11/12 0500)   PRN Meds:.dextromethorphan-guaiFENesin, metoprolol, midazolam, morphine injection, ondansetron (ZOFRAN) IV, oxyCODONE-acetaminophen, sodium chloride, traMADol  General appearance: alert and cooperative Neurologic: intact without focal deficits Heart: RRR, no murmur Lungs: diminished breath sounds base at bases Abdomen: soft, non-tender; bowel sounds normal; no masses,  no  organomegaly Extremities: edema mild RLE edema;SCD on LLE.LUE wound is clean and dry. Wounds:Clean and dry.  Lab Results: CBC:  Basename 01/13/12 0400 01/12/12 0349  WBC 17.7* 15.5*  HGB 10.1* 10.1*  HCT 29.9* 30.3*  PLT 202 155   BMET:   Basename 01/13/12 0400 01/12/12 0349  NA 135 136  K 3.9 4.2  CL 93* 97  CO2 32 32  GLUCOSE 109* 126*  BUN 19 12  CREATININE 0.69 0.66  CALCIUM 9.1 8.6    PT/INR:   Basename 01/10/12 1658  LABPROT 16.4*  INR 1.30   Radiology: Dg Chest Portable 1 View In Am  01/12/2012  *RADIOLOGY REPORT*  Clinical Data: Postop CABG  PORTABLE CHEST - 1 VIEW  Comparison: Portable chest x-ray of 01/11/2012  Findings: Aeration has improved slightly.  The Swan-Ganz catheter has been removed and a venous sheath remains in the SVC.  A left chest tube is present.  No pneumothorax is noted.  Cardiomegaly is stable, and mild basilar atelectasis remain  IMPRESSION: Slightly better aeration.  Swan-Ganz catheter removed.  No pneumothorax with left chest tube remaining.   Original Report Authenticated By: Juline Patch, M.D.      Assessment/Plan: S/P Procedure(s) (LRB): REDO CORONARY ARTERY BYPASS GRAFTING (CABG) (N/A) RADIAL ARTERY HARVEST (Left) 1.CV-SR.Continue Lopressor  25 bid, Amiodarone 400 bid, and continue Imdur 30 daily. 2.Pulmonary- Encourage incentive spirometer and flutter valve.CXR this am shows improvement in aeration, bilateral pleural effusions L>R,and no ptx  3.Volume overload-on Lasix 20 IV bid. Will change to PO upon transfer 4.ABL anemia- H and H 10.1 and 29.9 this am. 5.HGA1C 5.8. CBGs 106/148/105.Continue insulin.Will need further surveillance as an outpatient. 6.Leukocytosis-WBC increased to 17,700.Remains afebrile and no GU complaints.No signs of wound infection. Has had atelectasis on CXR and only POD3. Monitor 7.Supplement potassium 8.Likely transfer to 2000.   Donielle Zimmerma PA-C 01/13/2012 7:23 AM

## 2012-01-13 NOTE — Progress Notes (Signed)
POD # 3 Redo CABG  Up in chair  BP 97/62  Pulse 87  Temp 98.4 F (36.9 C) (Oral)  Resp 22  Ht 5\' 3"  (1.6 m)  Wt 172 lb 14.4 oz (78.427 kg)  BMI 30.63 kg/m2  SpO2 95%   Intake/Output Summary (Last 24 hours) at 01/13/12 1739 Last data filed at 01/13/12 1500  Gross per 24 hour  Intake   1123 ml  Output   1150 ml  Net    -27 ml    Stable day

## 2012-01-13 NOTE — Progress Notes (Signed)
Report called to Inetta Fermo, RN and met her at bedside.  Patient transferred to 2017 and attached to telemetry monitor without complications.  All belongings were with patient upon transfer.

## 2012-01-14 ENCOUNTER — Inpatient Hospital Stay (HOSPITAL_COMMUNITY): Payer: Medicaid Other

## 2012-01-14 MED ORDER — NICOTINE 21 MG/24HR TD PT24: 1.0000 | MEDICATED_PATCH | Freq: Every day | TRANSDERMAL | Status: DC

## 2012-01-14 MED ORDER — METOPROLOL TARTRATE 25 MG PO TABS: 25.0000 mg | ORAL_TABLET | Freq: Two times a day (BID) | ORAL | Status: AC

## 2012-01-14 MED ORDER — LORAZEPAM 0.5 MG PO TABS: 0.5000 mg | ORAL_TABLET | Freq: Four times a day (QID) | ORAL | Status: DC | PRN

## 2012-01-14 MED ORDER — POTASSIUM CHLORIDE CRYS ER 20 MEQ PO TBCR
20.0000 meq | EXTENDED_RELEASE_TABLET | Freq: Every day | ORAL | Status: DC
  Administered 2012-01-14 – 2012-01-15 (×2): 20 meq via ORAL
  Filled 2012-01-14 (×2): qty 1

## 2012-01-14 MED ORDER — FUROSEMIDE 40 MG PO TABS: 40.0000 mg | ORAL_TABLET | Freq: Every day | ORAL | Status: DC

## 2012-01-14 MED ORDER — FUROSEMIDE 40 MG PO TABS
40.0000 mg | ORAL_TABLET | Freq: Every day | ORAL | Status: DC
  Administered 2012-01-14 – 2012-01-15 (×2): 40 mg via ORAL
  Filled 2012-01-14 (×2): qty 1

## 2012-01-14 MED ORDER — SODIUM CHLORIDE 0.9 % IV SOLN: 250.0000 mL | INTRAVENOUS | Status: DC | PRN

## 2012-01-14 MED ORDER — NICOTINE 21 MG/24HR TD PT24
21.0000 mg | MEDICATED_PATCH | Freq: Every day | TRANSDERMAL | Status: DC
  Filled 2012-01-14 (×2): qty 1

## 2012-01-14 MED ORDER — DIPHENHYDRAMINE HCL 25 MG PO CAPS
25.0000 mg | ORAL_CAPSULE | Freq: Every evening | ORAL | Status: DC | PRN
  Administered 2012-01-14: 25 mg via ORAL
  Filled 2012-01-14: qty 1

## 2012-01-14 MED ORDER — LEVALBUTEROL HCL 1.25 MG/0.5ML IN NEBU
1.2500 mg | INHALATION_SOLUTION | Freq: Four times a day (QID) | RESPIRATORY_TRACT | Status: DC | PRN
  Filled 2012-01-14: qty 0.5

## 2012-01-14 MED ORDER — ATORVASTATIN CALCIUM 80 MG PO TABS: 80.0000 mg | ORAL_TABLET | Freq: Every day | ORAL | Status: AC

## 2012-01-14 MED ORDER — ISOSORBIDE MONONITRATE ER 30 MG PO TB24: 30.0000 mg | ORAL_TABLET | Freq: Every day | ORAL | Status: AC

## 2012-01-14 MED ORDER — SODIUM CHLORIDE 0.9 % IJ SOLN: 3.0000 mL | Freq: Two times a day (BID) | INTRAMUSCULAR | Status: DC

## 2012-01-14 MED ORDER — AMIODARONE HCL 400 MG PO TABS: 400.0000 mg | ORAL_TABLET | Freq: Two times a day (BID) | ORAL | Status: DC

## 2012-01-14 MED ORDER — POTASSIUM CHLORIDE CRYS ER 20 MEQ PO TBCR: 20.0000 meq | EXTENDED_RELEASE_TABLET | Freq: Every day | ORAL | Status: DC

## 2012-01-14 MED ORDER — MOVING RIGHT ALONG BOOK
Freq: Once | Status: AC
  Administered 2012-01-14: 08:00:00
  Filled 2012-01-14: qty 1

## 2012-01-14 MED ORDER — OXYCODONE-ACETAMINOPHEN 5-325 MG PO TABS: 2.0000 | ORAL_TABLET | ORAL | Status: DC | PRN

## 2012-01-14 MED ORDER — SODIUM CHLORIDE 0.9 % IJ SOLN: 3.0000 mL | INTRAMUSCULAR | Status: DC | PRN

## 2012-01-14 NOTE — Progress Notes (Signed)
Chaplain visited patient during normal rounds at 2305. Patient was tired and exhausted after surgery. Chaplain shared words of encouragement and comfort with patient. Chaplain will continue to visit and provide spiritual care to both patient and family members as needed at a pater time.

## 2012-01-14 NOTE — Plan of Care (Signed)
Problem: Phase III Progression Outcomes Goal: Transfer to PCTU/Telemetry POD Outcome: Completed/Met Date Met:  01/14/12 3rd day post op

## 2012-01-14 NOTE — Discharge Summary (Signed)
Physician Discharge Summary  Patient ID: Jonathan Mason MRN: [id number] DOB/AGE: 1976/01/01 36 y.o.  Admit date: 01/06/2012 Discharge date: 01/15/2012  Admission Diagnoses: 1.History of CAD (s/p MI,CABGx 2 2008) 2.History of hyperlipidemia 3.History of tobacco abuse 4.History of chronic back pain 5.History of herniated disc  Discharge Diagnoses:  1.History of CAD (s/p MI,CABGx 2 2008) 2.History of hyperlipidemia 3.History of tobacco abuse 4.History of chronic back pain 5.History of herniated disc   Procedure (s):  1.Cardiac catheterization done by Dr. Jacinto Halim on 01/06/2012: Hemodynamic data:  Left ventricular pressure was 118/5with LVEDP of 12 mm mercury. Aortic pressure was 112/84 with a mean of 97 mm mercury.There was no pressure gradient across the aortic valve.  Left ventricle: Performed in the RAO and LAO projection revealed LVEF of 40%. Mild diffuse hypokinesis and lateral wall akinesis.  Right coronary artery: dominant vessel, occluded in the proximal segment. Distally supplied by saphenous vein graft. Contralateral collaterals from the left system to the distal right coronary artery is visualized. The PDA appears to be very large.  SVG to RCA: Is ectatic, severely diffusely diseased and appears to be occluded in the distal segment. This appears to be the culprit and probably subacute occlusion. Distal occlusion appears to be old thrombus.  Left main coronary artery is large and normal.  Circumflex coronary artery: A large vessel giving origin to a large obtuse marginal 1. The circumflex coronary artery is occluded in the proximal segment. The OM-1 is very large and fills retrogradely through left left collaterals. Faint collaterals to the right coronary artery distal bed is also evident.  LAD: LAD gives origin to a large diagonal 1. LAD has proximal and midsegment long 80% stenosis. D1 in the mid segment has 80-90% stenosis. Is a large vessel.  Ramus intermediate: This is a  proximal 70% stenosis.  Left subdural artery and LIMA, right subdural artery and RIMA widely patent  2. Redo sternotomy, redo coronary artery bypass grafting x4 (left  internal mammary artery to left anterior descending, left radial  artery free graft to diagonal, saphenous vein graft to circumflex  marginal, saphenous vein graft to posterior descending) with endoscopic harvest of right leg greater saphenous vein by Dr. Donata Clay on 01/10/2012.  History of Presenting Illness: This is a 36 year old Caucasian male smoker status post CABG x2 at [name] 2008, admitted to the hospital 01/06/2012, with unstable angina. Initial set of cardiac enzymes were negative. A second set of enzymes show an elevated troponin. EKG showed no change.  2 D Echo done 01/10/2012. Results showed  LVEF 35-40%, hypokinesis of the inferior myocardium, and mild MR.Cardiac catheterization showed an EF of 40% with inferior lateral hypokinesia, occluded vein graft to the RCA distribution, 80% LAD stenosis, occlusion of a circumflex marginal, 70% stenosis the ramus intermediate. The distal right fills faintly from left to right collaterals. LVEDP is 14 mm mercury. No evidence of aortic stenosis at the time of cardiac catheterization.He was placed on  IV heparin and nitroglycerin and remained chest pain free. Patient states he recovered fairly well from his initial heart bypass operation. He continues to smoke one pack of cigarettes a day. He has a previous history of cocaine abuse but is rapid drug screen on presentation to our ED is negative. A cardiothoracic consultation was obtained with Dr. Donata Clay for the consideration of a redo sternotomy and CABG. Potential risks, benefits, and complications were discussed with the patient and he agreed to proceed.  Brief Hospital Course:  He was extubated  the evening of surgery. His Theone Murdoch and a line were removed on post op day 1. He was weaned off of Dopamine and Nitro (on  for open left radial artery harvest). His chest tubes and foley were removed on post op day 2.He was started on Lopressor and Imdur for radial artery harvest.He was volume overloaded and diuresed accordingly. He was found to have mild ABL anemia. His last H and H was stable at 10.1 and 29.9. His HGA1C pre op was 5.8. His glucose remained well controlled with insulin. He will need follow up as an outpatient as he is pre diabetic.He was felt surgically stable for transfer from the ICU to PCTU on  01/13/2012. He has already been tolerating a diet and has had a bowel movement. He has been ambulating well on room air. His epicardial pacing wires and chest tube sutures will be removed prior to his discharge. Provided he remains afebrile, hemodynamically stable, and pending morning round evaluation, he will be surgically stable for discharge on 01/15/2012.   Latest Vital Signs: Blood pressure 107/65, pulse 87, temperature 98.6 F (37 C), temperature source Oral, resp. rate 16, height 5\' 3"  (1.6 m), weight 169 lb 8 oz (76.885 kg), SpO2 92.00%.  Physical Exam: General appearance: alert and cooperative  Neurologic: intact without focal deficits  Heart: RRR, no murmur  Lungs: diminished breath sounds base at bases L>R  Abdomen: soft, non-tender; bowel sounds normal; no masses, no organomegaly  Extremities: mild RLE edema;SCD on LLE.LUE wound is clean and dry.  Wounds:Clean and dry.   Discharge Condition:Stable  Recent laboratory studies:  Lab Results  Component Value Date   WBC 17.7* 01/13/2012   HGB 10.1* 01/13/2012   HCT 29.9* 01/13/2012   MCV 92.3 01/13/2012   PLT 202 01/13/2012   Lab Results  Component Value Date   NA 135 01/13/2012   K 3.9 01/13/2012   CL 93* 01/13/2012   CO2 32 01/13/2012   CREATININE 0.69 01/13/2012   GLUCOSE 109* 01/13/2012      Diagnostic Studies: Dg Chest 2 View  01/14/2012  *RADIOLOGY REPORT*  Clinical Data: Status post CABG.  Sore.  CHEST - 2 VIEW  Comparison: 01/13/2012   Findings: The patient has had median sternotomy.  Right IJ sheath has been removed.  There is persistent bibasilar atelectasis, stable in appearance.  No evidence for pulmonary edema.  There are small bilateral pleural effusions.  No pneumothorax.  IMPRESSION:  1.  Stable bilateral atelectasis and small effusions. 2.  Cardiomegaly without pulmonary edema.   Original Report Authenticated By: Patterson Hammersmith, M.D.     Discharge Orders    Future Appointments: Provider: Department: Dept Phone: Center:   02/09/2012 12:30 PM Kerin Perna, MD [facility] [phone] [facility]      Discharge Medications:   Medication List     As of 01/14/2012 12:41 PM    STOP taking these medications         benazepril 20 MG tablet   Commonly known as: LOTENSIN      carvedilol 6.25 MG tablet   Commonly known as: COREG      oxyCODONE-acetaminophen 10-325 MG per tablet   Commonly known as: PERCOCET      simvastatin 20 MG tablet   Commonly known as: ZOCOR      TAKE these medications         amiodarone 400 MG tablet   Commonly known as: PACERONE   Take 1 tablet (400 mg total) by mouth  2 (two) times daily. For 3 days then take Amiodarone 400 mg po daily thereafter      aspirin 325 MG tablet   Take 325 mg by mouth daily.      atorvastatin 80 MG tablet   Commonly known as: LIPITOR   Take 1 tablet (80 mg total) by mouth daily at 6 PM.      furosemide 40 MG tablet   Commonly known as: LASIX   Take 1 tablet (40 mg total) by mouth daily. For 5 days then stop      isosorbide mononitrate 30 MG 24 hr tablet   Commonly known as: IMDUR   Take 1 tablet (30 mg total) by mouth daily.      LOVAZA PO   Take 1,000 mg by mouth 4 (four) times daily.      metoprolol tartrate 25 MG tablet   Commonly known as: LOPRESSOR   Take 1 tablet (25 mg total) by mouth 2 (two) times daily.      nicotine 21 mg/24hr patch   Commonly known as: NICODERM CQ - dosed in mg/24 hours   Place 1 patch onto the skin daily.       oxyCODONE-acetaminophen 5-325 MG per tablet   Commonly known as: PERCOCET/ROXICET   Take 2 tablets by mouth every 4 (four) hours as needed for pain.      potassium chloride SA 20 MEQ tablet   Commonly known as: K-DUR,KLOR-CON   Take 1 tablet (20 mEq total) by mouth daily. For 5 days then stop      The patient has been discharged on:   1.Beta Blocker:  Yes [ x  ]                              No   [   ]                              If No, reason:  2.Ace Inhibitor/ARB: Yes [   ]                                     No  [  x  ]                                     If No, reason: Blood pressure labile.Will consider restarting as outpatient.  3.Statin:   Yes [   x]                  No  [   ]                  If No, reason:  4.Ecasa:  Yes  [  x ]                  No   [   ]                  If No, reason:  Follow Up Appointments:     Follow-up Information    Follow up with Pamella Pert, MD. (For 2 weeks)    Contact information:   [address]  [place] [address] [phone]  Follow up with VAN Dinah Beers, MD. (PA/LAT CXR to be taken (at [facility] which is in the same building as the Dr. Zenaida Niece Trigt's office) on 02/09/2012 at 11:30 am;Appointment with Dr. Laneta Simmers is on 02/09/2012 at 12:30 pm)    Contact information:   [address] [place] [address] [phone]       Schedule an appointment as soon as possible for a visit with BADGER,MICHAEL C, MD. (As soon as possible regarding further surveillance of HG1C 5.8)    Contact information:   [address] [place] [address] [phone]          Signed: Doree Fudge MPA-C 01/14/2012, 12:41 PM

## 2012-01-14 NOTE — Progress Notes (Signed)
TCTS DAILY PROGRESS NOTE                   [address]            [place] [address]          [phone]      4 Days Post-Op Procedure(s) (LRB): REDO CORONARY ARTERY BYPASS GRAFTING (CABG) (N/A) RADIAL ARTERY HARVEST (Left)  Total Length of Stay:  LOS: 8 days   Subjective: Patient not able to sleep. Appetite is slowly improving.  Objective: Vital signs in last 24 hours: Temp:  [97.2 F (36.2 C)-98.6 F (37 C)] 98.6 F (37 C) (09/20 0423) Pulse Rate:  [85-106] 87  (09/20 0423) Cardiac Rhythm:  [-] Normal sinus rhythm (09/19 1940) Resp:  [16-23] 16  (09/20 0423) BP: (90-110)/(51-71) 107/65 mmHg (09/20 0423) SpO2:  [92 %-100 %] 92 % (09/20 0817) Weight:  [169 lb 8 oz (76.885 kg)] 169 lb 8 oz (76.885 kg) (09/20 0423)  Filed Weights   01/12/12 0457 01/13/12 0600 01/14/12 0423  Weight: 178 lb 12.7 oz (81.1 kg) 172 lb 14.4 oz (78.427 kg) 169 lb 8 oz (76.885 kg)    Weight change: -3 lb 6.4 oz (-1.542 kg)       Intake/Output from previous day: 09/19 0701 - 09/20 0700 In: 540 [P.O.:540] Out: 850 [Urine:850]  Total I/O In: 240 [P.O.:240] Out: 250 [Urine:250] Current Meds: Scheduled Meds:   . acetaminophen (TYLENOL) oral liquid 160 mg/5 mL  650 mg Per Tube NOW   Or  . acetaminophen  650 mg Rectal NOW  . acetaminophen  1,000 mg Oral Q6H   Or  . acetaminophen (TYLENOL) oral liquid 160 mg/5 mL  975 mg Per Tube Q6H  . aspirin EC  325 mg Oral Daily   Or  . aspirin  324 mg Per Tube Daily  . atorvastatin  80 mg Oral q1800  . bisacodyl  10 mg Oral Daily   Or  . bisacodyl  10 mg Rectal Daily  . budesonide  0.5 mg Nebulization BID  . cefUROXime (ZINACEF)  IV  1.5 g Intravenous To OR  . cefUROXime (ZINACEF)  IV  1.5 g Intravenous Q12H  . Chlorhexidine Gluconate Cloth  6 each Topical Daily  . dexmedetomidine  0.1-0.7 mcg/kg/hr Intravenous To OR  . docusate sodium  200 mg Oral Daily  . insulin aspart  0-24 Units Subcutaneous Q2H   Followed by  .  insulin aspart  0-24 Units Subcutaneous Q4H  . insulin (NOVOLIN-R) infusion   Intravenous To OR  . insulin regular  0-10 Units Intravenous TID WC  . isosorbide mononitrate  15 mg Oral Daily  . levalbuterol  1.25 mg Nebulization Q6H  . magnesium sulfate  4 g Intravenous Once  . metoprolol tartrate  12.5 mg Oral BID   Or  . metoprolol tartrate  12.5 mg Per Tube BID  . mupirocin ointment  1 application Nasal BID  . nitroglycerin-nicardipine-HEPARIN-sodium bicarbonate irrigation for artery spasm   Irrigation To OR  . nitroglycerin-nicardipine-HEPARIN-sodium bicarbonate irrigation for artery spasm   Irrigation To OR  . omega-3 acid ethyl esters  2 g Oral BID  . pantoprazole  40 mg Oral Q1200  . potassium chloride  10 mEq Intravenous Q1 Hr x 3  . sodium chloride  3 mL Intravenous Q12H  . tranexamic acid  15 mg/kg Intravenous To OR  . tranexamic acid (CYKLOKAPRON) infusion ([facility])  1.5 mg/kg/hr Intravenous To OR  . vancomycin  1,250  mg Intravenous To OR  . vancomycin  1,000 mg Intravenous Q24H   Continuous Infusions:   . sodium chloride Stopped (01/10/12 1719)  . sodium chloride    . sodium chloride 250 mL (01/11/12 0600)  . dexmedetomidine Stopped (01/11/12 0200)  . DOPamine 3 mcg/kg/min (01/10/12 2000)  . insulin (NOVOLIN-R) infusion Stopped (01/11/12 0400)  . lactated ringers 60 mL (01/10/12 1700)  . nitroGLYCERIN 3 mcg/min (01/10/12 2000)  . phenylephrine (NEO-SYNEPHRINE) Adult infusion Stopped (01/11/12 0500)   PRN Meds:.sodium chloride, dextromethorphan-guaiFENesin, diphenhydrAMINE, LORazepam, metoprolol, ondansetron (ZOFRAN) IV, oxyCODONE-acetaminophen, sodium chloride, traMADol  General appearance: alert and cooperative Neurologic: intact without focal deficits Heart: RRR, no murmur Lungs: diminished breath sounds base at bases L>R Abdomen: soft, non-tender; bowel sounds normal; no masses,  no organomegaly Extremities: mild RLE edema;SCD on LLE.LUE wound is clean and  dry. Wounds:Clean and dry.  Lab Results: CBC:  Basename 01/13/12 0400 01/12/12 0349  WBC 17.7* 15.5*  HGB 10.1* 10.1*  HCT 29.9* 30.3*  PLT 202 155   BMET:   Basename 01/13/12 0400 01/12/12 0349  NA 135 136  K 3.9 4.2  CL 93* 97  CO2 32 32  GLUCOSE 109* 126*  BUN 19 12  CREATININE 0.69 0.66  CALCIUM 9.1 8.6    PT/INR:  No results found for this basename: LABPROT,INR in the last 72 hours Radiology: Dg Chest 2 View  01/14/2012  *RADIOLOGY REPORT*  Clinical Data: Status post CABG.  Sore.  CHEST - 2 VIEW  Comparison: 01/13/2012  Findings: The patient has had median sternotomy.  Right IJ sheath has been removed.  There is persistent bibasilar atelectasis, stable in appearance.  No evidence for pulmonary edema.  There are small bilateral pleural effusions.  No pneumothorax.  IMPRESSION:  1.  Stable bilateral atelectasis and small effusions. 2.  Cardiomegaly without pulmonary edema.   Original Report Authenticated By: Patterson Hammersmith, M.D.    Dg Chest 2 View  01/13/2012  *RADIOLOGY REPORT*  Clinical Data: CABG  CHEST - 2 VIEW  Comparison: Plain film 01/12/2012  Findings: Interval removal of left chest tube.  No appreciable pneumothorax.  There is atelectasis at the left lung base which is improved compared to prior.  Right lung is clear.  IJ sheath remains.  Additionally, removal of mediastinal drain. Small bilateral pleural effusion  seen on the lateral projection.  IMPRESSION:  1. Removal of left chest tube without pneumothorax. 2.  Improved left basilar atelectasis.   Original Report Authenticated By: Genevive Bi, M.D.      Assessment/Plan: S/P Procedure(s) (LRB): REDO CORONARY ARTERY BYPASS GRAFTING (CABG) (N/A) RADIAL ARTERY HARVEST (Left) 1.CV-SR.Continue Lopressor  25 bid, Amiodarone 400 bid, and continue Imdur 30 daily. 2.Pulmonary- Encourage incentive spirometer and flutter valve.CXR this am show stable,, bilateral pleural effusions L>R no ptx , and  cardiomegaly. 3.Volume overload-Continue with Lasix 4.ABL anemia- H and H 10.1 and 29.9 this am. 5.HGA1C 5.8. CBGs 120/140/122.Stop CBGs and scheduled Insulin.Will need further surveillance as an outpatient. 6.Leukocytosis-Last WBC increased to 17,700.Remains afebrile and no GU complaints.No signs of wound infection. Has had atelectasis on CXR and only POD3. Check in am 7.Supplement potassium 8.Remove EPW in am 9.Benadryl for sleep 10.Discharge over the weekend.   Ailyne Pawley Zimmerma PA-C 01/14/2012 8:35 AM

## 2012-01-14 NOTE — Progress Notes (Signed)
CARDIAC REHAB PHASE I   PRE:  Rate/Rhythm: 90SR    BP: sitting 119/65    SaO2: 93 RA  MODE:  Ambulation: 550 ft   POST:  Rate/Rhythm: 106 ST    BP: sitting 122/64     SaO2: 95 RA  Pts pain is an issue. Sts this surgery much worse than first. Able to walk without RW but seems miserable. Several rest stops. VSS. Began ed. Pt is upset with himself that he went back to smoking, etc. Very motivated to quit for good. Gave resources. Discussed sugars for his triglycerides. Sts he is interested in [facility] and will send referral but will need reinforcement with this. Will f/u. Elevated feet on chair as legs are swollen.  [id number]  Harriet Masson CES, ACSM

## 2012-01-15 LAB — BASIC METABOLIC PANEL
BUN: 22 mg/dL (ref 6–23)
CO2: 30 mEq/L (ref 19–32)
Calcium: 9.2 mg/dL (ref 8.4–10.5)
Chloride: 95 mEq/L — ABNORMAL LOW (ref 96–112)
Creatinine, Ser: 0.78 mg/dL (ref 0.50–1.35)
GFR calc Af Amer: >90 mL/min (ref >90)
GFR calc non Af Amer: >90 mL/min (ref >90)
Glucose, Bld: 105 mg/dL — ABNORMAL HIGH (ref 70–99)
Potassium: 3.6 mEq/L (ref 3.5–5.1)
Sodium: 137 mEq/L (ref 135–145)

## 2012-01-15 LAB — CBC
HCT: 28 % — ABNORMAL LOW (ref 39.0–52.0)
Hemoglobin: 9.3 g/dL — ABNORMAL LOW (ref 13.0–17.0)
MCH: 30.8 pg (ref 26.0–34.0)
MCHC: 33.2 g/dL (ref 30.0–36.0)
MCV: 92.7 fL (ref 78.0–100.0)
Platelets: 334 10*3/uL (ref 150–400)
RBC: 3.02 MIL/uL — ABNORMAL LOW (ref 4.22–5.81)
RDW: 12.3 % (ref 11.5–15.5)
WBC: 14 10*3/uL — ABNORMAL HIGH (ref 4.0–10.5)

## 2012-01-15 MED ORDER — POTASSIUM CHLORIDE CRYS ER 20 MEQ PO TBCR: 20.0000 meq | EXTENDED_RELEASE_TABLET | Freq: Every day | ORAL | Status: DC

## 2012-01-15 MED ORDER — OXYCODONE-ACETAMINOPHEN 5-325 MG PO TABS: 1.0000 | ORAL_TABLET | ORAL | Status: DC | PRN

## 2012-01-15 NOTE — Progress Notes (Signed)
EPW and CTS removed per order and protocol. Patient tolerated well. Advised one hour of bedrest. Will cont. To monitor patient.

## 2012-01-15 NOTE — Progress Notes (Signed)
VSS stable through out monitoring after removal of EPW and CTS. Discharged home per order today.

## 2012-01-15 NOTE — Progress Notes (Signed)
Patient discharge instructions reviewed with patient and family. All questions answered, prescriptions given. Three day supply of Imdur, Metoprolol, Lipitor given to patient per Case management. Patient discharged home.

## 2012-01-15 NOTE — Progress Notes (Addendum)
TCTS DAILY PROGRESS NOTE                   [address]            [place] [address]          [phone]      5 Days Post-Op Procedure(s) (LRB): REDO CORONARY ARTERY BYPASS GRAFTING (CABG) (N/A) RADIAL ARTERY HARVEST (Left)  Total Length of Stay:  LOS: 9 days   Subjective: Patient slept better last night. Wants to go home.  Objective: Vital signs in last 24 hours: Temp:  [97.5 F (36.4 C)-98.3 F (36.8 C)] 98.1 F (36.7 C) (09/21 0422) Pulse Rate:  [68-86] 83  (09/21 0422) Cardiac Rhythm:  [-] Normal sinus rhythm (09/21 0422) Resp:  [18-19] 18  (09/21 0422) BP: (95-115)/(60-100) 113/73 mmHg (09/21 0422) SpO2:  [92 %-97 %] 95 % (09/21 0422) Weight:  [167 lb 12.3 oz (76.1 kg)] 167 lb 12.3 oz (76.1 kg) (09/21 0422)  Filed Weights   01/13/12 0600 01/14/12 0423 01/15/12 0422  Weight: 172 lb 14.4 oz (78.427 kg) 169 lb 8 oz (76.885 kg) 167 lb 12.3 oz (76.1 kg)    Weight change: -1 lb 11.7 oz (-0.785 kg)       Intake/Output from previous day: 09/20 0701 - 09/21 0700 In: 480 [P.O.:480] Out: 1150 [Urine:1150]    Physical Exam: General appearance: alert and cooperative Neurologic: intact without focal deficits Heart: RRR, no murmur Lungs: slightly diminished breath sounds base at bases Abdomen: soft, non-tender; bowel sounds normal; no masses,  no organomegaly Extremities: mild RLE edema.LUE wound is clean and dry. Wounds:Clean and dry.  Lab Results: CBC:  Basename 01/15/12 0455 01/13/12 0400  WBC 14.0* 17.7*  HGB 9.3* 10.1*  HCT 28.0* 29.9*  PLT 334 202   BMET:   Basename 01/15/12 0455 01/13/12 0400  NA 137 135  K 3.6 3.9  CL 95* 93*  CO2 30 32  GLUCOSE 105* 109*  BUN 22 19  CREATININE 0.78 0.69  CALCIUM 9.2 9.1    PT/INR:  No results found for this basename: LABPROT,INR in the last 72 hours Radiology: Dg Chest 2 View  01/14/2012  *RADIOLOGY REPORT*  Clinical Data: Status post CABG.  Sore.  CHEST - 2 VIEW  Comparison: 01/13/2012   Findings: The patient has had median sternotomy.  Right IJ sheath has been removed.  There is persistent bibasilar atelectasis, stable in appearance.  No evidence for pulmonary edema.  There are small bilateral pleural effusions.  No pneumothorax.  IMPRESSION:  1.  Stable bilateral atelectasis and small effusions. 2.  Cardiomegaly without pulmonary edema.   Original Report Authenticated By: Patterson Hammersmith, M.D.     Assessment/Plan: S/P Procedure(s) (LRB): REDO CORONARY ARTERY BYPASS GRAFTING (CABG) (N/A) RADIAL ARTERY HARVEST (Left) 1.CV-SR.Continue Lopressor  25 bid, Amiodarone 400 bid, and continue Imdur 30 daily. 2.Pulmonary- Encourage incentive spirometer and flutter valve.CXR this am show stable,, bilateral pleural effusions L>R no ptx , and cardiomegaly. 3.Volume overload-Continue with Lasix 4.ABL anemia- H and H 9.3 and 28 this am. 5.HGA1C 5.8. CBGs 120/140/122.Stop CBGs and scheduled Insulin.Will need further surveillance as an outpatient. 6.Leukocytosis-Last WBC decreased to 14,000.Remains afebrile and no GU complaints.No signs of wound infection. Has had atelectasis on CXR . 7.Remove EPW and chest tube sutures 8.Discharge   Donielle Zimmerma PA-C 01/15/2012 7:54 AM   I have seen and examined the patient and agree with the assessment and plan as outlined.  Purcell Nails 01/15/2012 11:33  AM    

## 2012-01-15 NOTE — Progress Notes (Signed)
CARDIAC REHAB PHASE I   PRE:  Rate/Rhythm: 86  BP:  Supine:   Sitting: 122/67  Standing:    SaO2: 95 RA  MODE:  Ambulation: 550 ft   POST:  Rate/Rhythem: 96  BP:  Supine:   Sitting:   Standing: 129/72   SaO2: 95 RA Patient ambulated well and had no rest breaks. Patient had no complaints and said he was better than yesterday. Patient excited to go home. Patients VSS. Tolerated well. Pleasure to work with.  Jalaiyah Throgmorton L. Manson Passey, MS, NASM, CES 08:15-08:25am   BROWN, Kameelah Minish L

## 2012-01-17 NOTE — Progress Notes (Signed)
   CARE MANAGEMENT NOTE 01/17/2012  Patient:  Jonathan Mason, Jonathan Mason   Account Number:  [id number]  Date Initiated:  01/07/2012  Documentation initiated by:  Junius Creamer  Subjective/Objective Assessment:   adm w ch pain, for cabg     Action/Plan:   lives w wife, pcp dr Casimiro Needle badger   Anticipated DC Date:  01/15/2012   Anticipated DC Plan:  HOME W HOME HEALTH SERVICES  In-house referral  Financial Counselor      DC Planning Services  CM consult  Medication Assistance      Choice offered to / List presented to:             Status of service:  Completed, signed off Medicare Important Message given?   (If response is "NO", the following Medicare IM given date fields will be blank) Date Medicare IM given:   Date Additional Medicare IM given:    Discharge Disposition:  HOME/SELF CARE  Per UR Regulation:  Reviewed for med. necessity/level of care/duration of stay  If discussed at Long Length of Stay Meetings, dates discussed:    Comments:  01/17/2012 1125  late entry 01/15/2012 1300 NCM spoke to pt and states he does not have any drug coverage or insurance at this time. NCM provided pt with community discount card that may assist with out of pocket meds. Explained he can utilize one of the discount pharmacy such as [facility], [facility] or [facility] that offers a discount on generic meds. Explained he may have to price shop each pharmacy for best deal. He can also go to needymeds.com to see if PAP programs are available for meds. NCM explained that generic usually do not have PAP programs but may offer rebates or coupons to reduce price. Contacted main pharmacy and pt is eligible for [employer] med assistance fund. Will provide 3 day supply for his medications. Isidoro Donning RN CCM Case Mgmt phone [phone]  01/11/12 JULIE AMERSON,RN,BSN 1330 PT S/P CABG X4 ON 01/10/12.  PTA, PT INDEPENDENT, LIVES WITH WIFE.  MET WITH PT AND MOTHER TO DISCUSS DC PLANS.  MOTHER STATES WIFE WORKS, BUT THEY WILL  COME UP WITH A PLAN TO PROVIDE 24H CARE AT DISCHARGE.   WILL REFER TO FINANCIAL COUNSELOR FOR CONCERNS REGARDING APPLYING FOR MEDICAID. WILL FOLLOW FOR DC PLANS.  9/13 9:35a debbie dowell rn,bsn [phone]

## 2012-01-17 NOTE — Discharge Summary (Signed)
patient examined and medical record reviewed,agree with above note. VAN TRIGT III,Librado Guandique 01/17/2012    

## 2012-01-21 ENCOUNTER — Ambulatory Visit (INDEPENDENT_AMBULATORY_CARE_PROVIDER_SITE_OTHER): Payer: Self-pay

## 2012-01-21 DIAGNOSIS — Z4802 Encounter for removal of sutures: Secondary | ICD-10-CM

## 2012-01-21 DIAGNOSIS — I2510 Atherosclerotic heart disease of native coronary artery without angina pectoris: Secondary | ICD-10-CM

## 2012-01-21 NOTE — Progress Notes (Signed)
Removed 36 staples from Lt anterior lower arm where radial artery was grafted. Pt tolerated well, with no signs of infection. Appt sch'ed to see Dr Donata Clay on 02/09/2012. Pt aware

## 2012-01-24 ENCOUNTER — Other Ambulatory Visit: Payer: Self-pay | Admitting: Cardiothoracic Surgery

## 2012-01-24 DIAGNOSIS — I2510 Atherosclerotic heart disease of native coronary artery without angina pectoris: Secondary | ICD-10-CM

## 2012-01-25 ENCOUNTER — Ambulatory Visit (INDEPENDENT_AMBULATORY_CARE_PROVIDER_SITE_OTHER): Payer: Self-pay | Admitting: Cardiothoracic Surgery

## 2012-01-25 ENCOUNTER — Ambulatory Visit
Admission: RE | Admit: 2012-01-25 | Discharge: 2012-01-25 | Disposition: A | Discharge status: Home / Self Care | Payer: No Typology Code available for payment source | Source: Ambulatory Visit | Attending: Cardiothoracic Surgery | Admitting: Cardiothoracic Surgery

## 2012-01-25 ENCOUNTER — Encounter: Payer: Self-pay | Admitting: Cardiothoracic Surgery

## 2012-01-25 VITALS — BP 104/61 | HR 73 | Resp 16 | Ht 63.0 in | Wt 168.0 lb

## 2012-01-25 DIAGNOSIS — Z951 Presence of aortocoronary bypass graft: Secondary | ICD-10-CM

## 2012-01-25 DIAGNOSIS — I2510 Atherosclerotic heart disease of native coronary artery without angina pectoris: Secondary | ICD-10-CM

## 2012-01-25 NOTE — Progress Notes (Signed)
PCP is Eartha Inch, MD Referring Provider is Pamella Pert, MD  Chief Complaint  Patient presents with  . Routine Post Op    c/o "popping in chest" s/p CABGx 4 01/10/12...cxr    HPI: Status post redo CABG x4 September 16, 2 weeks ago. Left IMA to LAD, radial artery free graft to diagonal, saphenous vein graft to OM, saphenous vein graft to distal posterior descending Smoker with diabetic pattern of disease Complaints of sternal instability but on exam sternum appears to be healing well, chest x-ray shows sternal wires intact, no brain is no erythema    Past Medical History  Diagnosis Date  . Back pain, chronic   . Coronary artery disease   . MI (myocardial infarction) 2007    has had two  . Herniated disc   . Renal disorder     kidney stones  . Hx of CABG     Past Surgical History  Procedure Date  . Coronary artery bypass graft   . Steriod injection 09/02/2011    Procedure: MINOR STEROID INJECTION;  Surgeon: Mat Carne, MD;  Location: [facility];  Service: Orthopedics;  Laterality: Left;  Epidural Steroid Injection Left Lumbar 4-5 and Lumbar 5-Sacral 1  . Coronary artery bypass graft 01/10/2012    Procedure: REDO CORONARY ARTERY BYPASS GRAFTING (CABG);  Surgeon: Kerin Perna, MD;  Location: [facility] OR;  Service: Open Heart Surgery;  Laterality: N/A;  . Radial artery harvest 01/10/2012    Procedure: RADIAL ARTERY HARVEST;  Surgeon: Kerin Perna, MD;  Location: [facility] OR;  Service: Open Heart Surgery;  Laterality: Left;    No family history on file.  Social History History  Substance Use Topics  . Smoking status: Current Every Day Smoker  . Smokeless tobacco: Not on file  . Alcohol Use: Yes    Current Outpatient Prescriptions  Medication Sig Dispense Refill  . amiodarone (PACERONE) 400 MG tablet Take 1 tablet (400 mg total) by mouth 2 (two) times daily. For 3 days then take Amiodarone 400 mg po daily thereafter  60 tablet  1  . aspirin 325  MG tablet Take 325 mg by mouth daily.      Marland Kitchen atorvastatin (LIPITOR) 80 MG tablet Take 1 tablet (80 mg total) by mouth daily at 6 PM.  30 tablet  1  . isosorbide mononitrate (IMDUR) 30 MG 24 hr tablet Take 1 tablet (30 mg total) by mouth daily.  30 tablet  0  . metoprolol tartrate (LOPRESSOR) 25 MG tablet Take 1 tablet (25 mg total) by mouth 2 (two) times daily.  60 tablet  1  . oxyCODONE-acetaminophen (PERCOCET/ROXICET) 5-325 MG per tablet Take 1-2 tablets by mouth every 4 (four) hours as needed. 10/325 mg tablets        Allergies  Allergen Reactions  . Darvocet (Propoxyphene-Acetaminophen)   . Gabapentin Other (See Comments)    Makes me drunk    Review of Systems no fever, no angina BP 104/61  Pulse 73  Resp 16  Ht 5\' 3"  (1.6 m)  Wt 168 lb (76.204 kg)  BMI 29.76 kg/m2  SpO2 98% Physical Exam Alert and comfortable Breath sounds clear Sinus rhythm without rub or gallop Left hand without motor deficit Diagnostic Tests: Chest x-ray shows sternal wires intact no pleural effusion clear lung fields  Impression: 2 weeks postop redo CABG doing well Plan: Return for followup appointment in 2 weeks continue current care

## 2012-02-09 ENCOUNTER — Encounter: Payer: Self-pay | Admitting: Cardiothoracic Surgery

## 2012-02-10 ENCOUNTER — Encounter: Payer: Self-pay | Admitting: Cardiothoracic Surgery

## 2012-02-10 ENCOUNTER — Ambulatory Visit (INDEPENDENT_AMBULATORY_CARE_PROVIDER_SITE_OTHER): Payer: Self-pay | Admitting: Cardiothoracic Surgery

## 2012-02-10 VITALS — BP 108/74 | HR 80 | Resp 18 | Ht 63.0 in | Wt 168.0 lb

## 2012-02-10 DIAGNOSIS — I2510 Atherosclerotic heart disease of native coronary artery without angina pectoris: Secondary | ICD-10-CM

## 2012-02-10 DIAGNOSIS — Z951 Presence of aortocoronary bypass graft: Secondary | ICD-10-CM

## 2012-02-10 NOTE — Progress Notes (Signed)
PCP is Eartha Inch, MD Referring Provider is Pamella Pert, MD  Chief Complaint  Patient presents with  . Routine Post Op    F/u from surgery, S/P CABG x 4 on 01/10/12    HPI: One month status post redo CABG with left IMA, left radial artery and vein. Some complaints over sternal clicking which I cannot elicit on exam. Sternal wound looks well healed. Chest x-ray looks intact. No recurrent angina no symptoms of CHF or edema Maintained sinus rhythm on amiodarone 400 twice a day which we decreased to 400 once a day   Past Medical History  Diagnosis Date  . Back pain, chronic   . Coronary artery disease   . MI (myocardial infarction) 2007    has had two  . Herniated disc   . Renal disorder     kidney stones  . Hx of CABG     Past Surgical History  Procedure Date  . Coronary artery bypass graft   . Steriod injection 09/02/2011    Procedure: MINOR STEROID INJECTION;  Surgeon: Mat Carne, MD;  Location: [facility];  Service: Orthopedics;  Laterality: Left;  Epidural Steroid Injection Left Lumbar 4-5 and Lumbar 5-Sacral 1  . Coronary artery bypass graft 01/10/2012    Procedure: REDO CORONARY ARTERY BYPASS GRAFTING (CABG);  Surgeon: Kerin Perna, MD;  Location: [facility] OR;  Service: Open Heart Surgery;  Laterality: N/A;  . Radial artery harvest 01/10/2012    Procedure: RADIAL ARTERY HARVEST;  Surgeon: Kerin Perna, MD;  Location: [facility] OR;  Service: Open Heart Surgery;  Laterality: Left;    No family history on file.  Social History History  Substance Use Topics  . Smoking status: Current Every Day Smoker  . Smokeless tobacco: Not on file  . Alcohol Use: Yes    Current Outpatient Prescriptions  Medication Sig Dispense Refill  . amiodarone (PACERONE) 400 MG tablet Take 1 tablet (400 mg total) by mouth 2 (two) times daily. For 3 days then take Amiodarone 400 mg po daily thereafter  60 tablet  1  . aspirin 325 MG tablet Take 325 mg by mouth daily.       Marland Kitchen atorvastatin (LIPITOR) 80 MG tablet Take 1 tablet (80 mg total) by mouth daily at 6 PM.  30 tablet  1  . isosorbide mononitrate (IMDUR) 30 MG 24 hr tablet Take 1 tablet (30 mg total) by mouth daily.  30 tablet  0  . metoprolol tartrate (LOPRESSOR) 25 MG tablet Take 1 tablet (25 mg total) by mouth 2 (two) times daily.  60 tablet  1  . oxyCODONE-acetaminophen (PERCOCET/ROXICET) 5-325 MG per tablet Take 1-2 tablets by mouth every 4 (four) hours as needed. 10/325 mg tablets        Allergies  Allergen Reactions  . Darvocet (Propoxyphene-Acetaminophen)   . Gabapentin Other (See Comments)    Makes me drunk    Review of Systems no fever, no angina, no external drainage  BP 108/74  Pulse 80  Resp 18  Ht 5\' 3"  (1.6 m)  Wt 168 lb (76.204 kg)  BMI 29.76 kg/m2  SpO2 98% Physical Exam Alert and comfortable Sternum intact and stable Breath sounds clear and equal Cardiac rhythm regular  Diagnostic Tests: Chest x-ray not performed today  Impression: Doing well one month after surgery. Refill provided for oxycodone. Patient encouraged to walk 20 minutes daily. Patient congratulated on smoking cessation.  Plan: Return for wound check in 6 weeks with chest x-ray.

## 2012-02-16 ENCOUNTER — Encounter (HOSPITAL_COMMUNITY): Payer: Self-pay | Admitting: *Deleted

## 2012-02-16 ENCOUNTER — Emergency Department (HOSPITAL_COMMUNITY)
Admission: EM | Admit: 2012-02-16 | Discharge: 2012-02-16 | Disposition: A | Discharge status: Home / Self Care | Payer: Self-pay | Attending: Emergency Medicine | Admitting: Emergency Medicine

## 2012-02-16 ENCOUNTER — Emergency Department (HOSPITAL_COMMUNITY): Payer: Self-pay

## 2012-02-16 DIAGNOSIS — R110 Nausea: Secondary | ICD-10-CM | POA: Insufficient documentation

## 2012-02-16 DIAGNOSIS — Z8739 Personal history of other diseases of the musculoskeletal system and connective tissue: Secondary | ICD-10-CM | POA: Insufficient documentation

## 2012-02-16 DIAGNOSIS — Z87442 Personal history of urinary calculi: Secondary | ICD-10-CM | POA: Insufficient documentation

## 2012-02-16 DIAGNOSIS — R630 Anorexia: Secondary | ICD-10-CM | POA: Insufficient documentation

## 2012-02-16 DIAGNOSIS — I2510 Atherosclerotic heart disease of native coronary artery without angina pectoris: Secondary | ICD-10-CM | POA: Insufficient documentation

## 2012-02-16 DIAGNOSIS — R109 Unspecified abdominal pain: Secondary | ICD-10-CM | POA: Insufficient documentation

## 2012-02-16 DIAGNOSIS — Z951 Presence of aortocoronary bypass graft: Secondary | ICD-10-CM | POA: Insufficient documentation

## 2012-02-16 DIAGNOSIS — D72829 Elevated white blood cell count, unspecified: Secondary | ICD-10-CM | POA: Insufficient documentation

## 2012-02-16 DIAGNOSIS — Z7982 Long term (current) use of aspirin: Secondary | ICD-10-CM | POA: Insufficient documentation

## 2012-02-16 DIAGNOSIS — Z87891 Personal history of nicotine dependence: Secondary | ICD-10-CM | POA: Insufficient documentation

## 2012-02-16 DIAGNOSIS — M79609 Pain in unspecified limb: Secondary | ICD-10-CM | POA: Insufficient documentation

## 2012-02-16 DIAGNOSIS — M549 Dorsalgia, unspecified: Secondary | ICD-10-CM | POA: Insufficient documentation

## 2012-02-16 DIAGNOSIS — I252 Old myocardial infarction: Secondary | ICD-10-CM | POA: Insufficient documentation

## 2012-02-16 DIAGNOSIS — Z79899 Other long term (current) drug therapy: Secondary | ICD-10-CM | POA: Insufficient documentation

## 2012-02-16 LAB — COMPREHENSIVE METABOLIC PANEL
BUN: 16 mg/dL (ref 6–23)
CO2: 27 mEq/L (ref 19–32)
Calcium: 10.4 mg/dL (ref 8.4–10.5)
Creatinine, Ser: 0.86 mg/dL (ref 0.50–1.35)
GFR calc Af Amer: >90 mL/min (ref >90)
GFR calc non Af Amer: >90 mL/min (ref >90)
Glucose, Bld: 111 mg/dL — ABNORMAL HIGH (ref 70–99)
Sodium: 139 mEq/L (ref 135–145)
Total Protein: 8.1 g/dL (ref 6.0–8.3)

## 2012-02-16 LAB — CBC WITH DIFFERENTIAL/PLATELET
Eosinophils Absolute: 0.9 10*3/uL — ABNORMAL HIGH (ref 0.0–0.7)
Eosinophils Relative: 6 % — ABNORMAL HIGH (ref 0–5)
HCT: 40.3 % (ref 39.0–52.0)
Lymphs Abs: 3.4 10*3/uL (ref 0.7–4.0)
MCH: 30 pg (ref 26.0–34.0)
MCV: 90.2 fL (ref 78.0–100.0)
Monocytes Absolute: 1.3 10*3/uL — ABNORMAL HIGH (ref 0.1–1.0)
Platelets: 318 10*3/uL (ref 150–400)
RBC: 4.47 MIL/uL (ref 4.22–5.81)

## 2012-02-16 LAB — URINALYSIS, ROUTINE W REFLEX MICROSCOPIC
Bilirubin Urine: NEGATIVE
Glucose, UA: NEGATIVE mg/dL
Hgb urine dipstick: NEGATIVE
Protein, ur: NEGATIVE mg/dL
Specific Gravity, Urine: 1.019 (ref 1.005–1.030)
Urobilinogen, UA: 1 mg/dL (ref 0.0–1.0)

## 2012-02-16 MED ORDER — OXYCODONE-ACETAMINOPHEN 5-325 MG PO TABS
2.0000 | ORAL_TABLET | Freq: Once | ORAL | Status: AC
  Administered 2012-02-16: 2 via ORAL
  Filled 2012-02-16: qty 2

## 2012-02-16 MED ORDER — OXYCODONE-ACETAMINOPHEN 5-325 MG PO TABS: 2.0000 | ORAL_TABLET | ORAL | Status: AC | PRN

## 2012-02-16 MED ORDER — IOHEXOL 300 MG/ML  SOLN
20.0000 mL | INTRAMUSCULAR | Status: AC
  Administered 2012-02-16 (×2): 20 mL via ORAL

## 2012-02-16 MED ORDER — IOHEXOL 300 MG/ML  SOLN
100.0000 mL | Freq: Once | INTRAMUSCULAR | Status: AC | PRN
  Administered 2012-02-16: 100 mL via INTRAVENOUS

## 2012-02-16 MED ORDER — PROMETHAZINE HCL 25 MG PO TABS: 25.0000 mg | ORAL_TABLET | Freq: Four times a day (QID) | ORAL | Status: AC | PRN

## 2012-02-16 MED ORDER — SODIUM CHLORIDE 0.9 % IV SOLN
INTRAVENOUS | Status: DC
  Administered 2012-02-16: 04:00:00 via INTRAVENOUS

## 2012-02-16 NOTE — ED Notes (Signed)
Pt to ED for 2 complaints.  Lower abd pain for several days with "stinging" when he urinates.  Pt with hx of renal calculi and he states this pain feels exactly the same.  He is also here for L foot pain that he describes as "pins and needles".  This is a chronic pain that he has been told is r/t herniated discs pressing on the nerve.

## 2012-02-16 NOTE — ED Provider Notes (Addendum)
History     CSN: [id number]  Arrival date & time 02/16/12  0050   First MD Initiated Contact with Patient 02/16/12 0255      Chief Complaint  Patient presents with  . Abdominal Pain  . Foot Pain    (Consider location/radiation/quality/duration/timing/severity/associated sxs/prior treatment) Patient is a 36 y.o. male presenting with abdominal pain. The history is provided by the patient.  Abdominal Pain The primary symptoms of the illness include abdominal pain. The primary symptoms of the illness do not include fever, shortness of breath, vomiting, diarrhea or dysuria.  Symptoms associated with the illness do not include chills, constipation, hematuria or back pain.   36 year old, male, complains of paresthesias in his right foot.  For several months.  No history of trauma.  No swelling.  No fevers, or chills. He complains of lower abdominal pain for the past 2 days, with nausea, and anorexia.  No vomiting.  No diarrhea.  He has history of kidney stones, and says that this feels similar to prior kidney stones.  He denies history of prior abdominal surgery.  Past Medical History  Diagnosis Date  . Back pain, chronic   . Coronary artery disease   . MI (myocardial infarction) 2007    has had two  . Herniated disc   . Renal disorder     kidney stones  . Hx of CABG     Past Surgical History  Procedure Date  . Coronary artery bypass graft   . Steriod injection 09/02/2011    Procedure: MINOR STEROID INJECTION;  Surgeon: Mat Carne, MD;  Location: [facility];  Service: Orthopedics;  Laterality: Left;  Epidural Steroid Injection Left Lumbar 4-5 and Lumbar 5-Sacral 1  . Coronary artery bypass graft 01/10/2012    Procedure: REDO CORONARY ARTERY BYPASS GRAFTING (CABG);  Surgeon: Kerin Perna, MD;  Location: [facility] OR;  Service: Open Heart Surgery;  Laterality: N/A;  . Radial artery harvest 01/10/2012    Procedure: RADIAL ARTERY HARVEST;  Surgeon: Kerin Perna,  MD;  Location: [facility] OR;  Service: Open Heart Surgery;  Laterality: Left;    No family history on file.  History  Substance Use Topics  . Smoking status: Former Smoker    Types: Cigarettes  . Smokeless tobacco: Not on file  . Alcohol Use: Yes      Review of Systems  Constitutional: Negative for fever and chills.  Respiratory: Negative for cough and shortness of breath.   Cardiovascular: Negative for chest pain.  Gastrointestinal: Positive for abdominal pain. Negative for vomiting, diarrhea and constipation.  Genitourinary: Negative for dysuria, hematuria and flank pain.  Musculoskeletal: Negative for back pain.  Skin: Negative for color change and rash.  Neurological: Negative for headaches.  Psychiatric/Behavioral: Negative for confusion.  All other systems reviewed and are negative.    Allergies  Gabapentin and Darvocet  Home Medications   Current Outpatient Rx  Name Route Sig Dispense Refill  . AMIODARONE HCL 400 MG PO TABS Oral Take 400 mg by mouth daily.    . ASPIRIN 325 MG PO TABS Oral Take 325 mg by mouth daily.    . ATORVASTATIN CALCIUM 80 MG PO TABS Oral Take 1 tablet (80 mg total) by mouth daily at 6 PM. 30 tablet 1  . ISOSORBIDE MONONITRATE ER 30 MG PO TB24 Oral Take 1 tablet (30 mg total) by mouth daily. 30 tablet 0  . METOPROLOL TARTRATE 25 MG PO TABS Oral Take 1 tablet (25 mg total) by  mouth 2 (two) times daily. 60 tablet 1  . OXYCODONE-ACETAMINOPHEN 10-325 MG PO TABS Oral Take 1-2 tablets by mouth every 4 (four) hours as needed. pain      BP 133/86  Pulse 72  Temp 98.4 F (36.9 C) (Oral)  Resp 18  SpO2 99%  Physical Exam  Nursing note and vitals reviewed. Constitutional: He is oriented to person, place, and time. He appears well-developed and well-nourished. No distress.  HENT:  Head: Normocephalic and atraumatic.  Eyes: Conjunctivae normal and EOM are normal.  Neck: Normal range of motion. Neck supple.  Cardiovascular: Normal rate, regular rhythm  and intact distal pulses.   No murmur heard. Pulmonary/Chest: Effort normal and breath sounds normal. No respiratory distress. He has no rales.  Abdominal: Soft. Bowel sounds are normal. He exhibits no distension. There is tenderness. There is no rebound and no guarding.       Tenderness across the lower abdomen from the left lower quadrant is improved.  The right lower quadrant.  No peritoneal signs  Musculoskeletal: Normal range of motion. He exhibits no edema.  Neurological: He is alert and oriented to person, place, and time.  Skin: Skin is warm and dry.  Psychiatric: He has a normal mood and affect. Thought content normal.    ED Course  Procedures (including critical care time) chronic, right foot, tingling sensation. Abdominal pain, with nausea, anorexia, and  abdominal tenderness We will perform laboratory testing, and a CAT scan for evaluation of possible appendicitis. Labs Reviewed  CBC WITH DIFFERENTIAL - Abnormal; Notable for the following:    WBC 16.5 (*)     Neutro Abs 10.8 (*)     Monocytes Absolute 1.3 (*)     Eosinophils Relative 6 (*)     Eosinophils Absolute 0.9 (*)     All other components within normal limits  COMPREHENSIVE METABOLIC PANEL - Abnormal; Notable for the following:    Glucose, Bld 111 (*)     All other components within normal limits  URINALYSIS, ROUTINE W REFLEX MICROSCOPIC   No results found.   No diagnosis found.  5:11 AM Pain controlled.  MDM  Abdominal pain, with leukocytosis        Cheri Guppy, MD 02/16/12 0981  Cheri Guppy, MD 02/16/12 [phone]

## 2012-03-28 ENCOUNTER — Other Ambulatory Visit: Payer: Self-pay | Admitting: Cardiothoracic Surgery

## 2012-03-28 ENCOUNTER — Telehealth: Payer: Self-pay | Admitting: *Deleted

## 2012-03-28 DIAGNOSIS — Z951 Presence of aortocoronary bypass graft: Secondary | ICD-10-CM

## 2012-03-28 NOTE — Telephone Encounter (Signed)
Jonathan Mason had a REDO CABG on 01/10/12 and was last seen for follow up in the office on 02/10/12.  He called this morning to say that he will be moving to [place], [place] on 04/07/12.  When asked, he said all of his operative sites have healed well.  He was letting us know that he will not be seeing Dr. Donata Clay anymore.  I suggested he discuss with Dr. Nadara Eaton referral to a cardiologist in [place] before he left if possible.  He agreed.

## 2012-03-29 ENCOUNTER — Ambulatory Visit: Payer: Medicaid Other | Admitting: Cardiothoracic Surgery

## 2012-06-28 DIAGNOSIS — Z0271 Encounter for disability determination: Secondary | ICD-10-CM

## 2012-10-31 DIAGNOSIS — Z0279 Encounter for issue of other medical certificate: Secondary | ICD-10-CM

## 2014-04-04 ENCOUNTER — Encounter (HOSPITAL_COMMUNITY): Payer: Self-pay | Admitting: Cardiology

## 2014-06-27 IMAGING — CR DG CHEST 2V
2 series · 2 of 2 positions shown · non-contrast
Comparison: 01/06/2012

CLINICAL DATA: Preoperative respiratory exam for repeat CABG.

CHEST - 2 VIEW

[w chest pa]
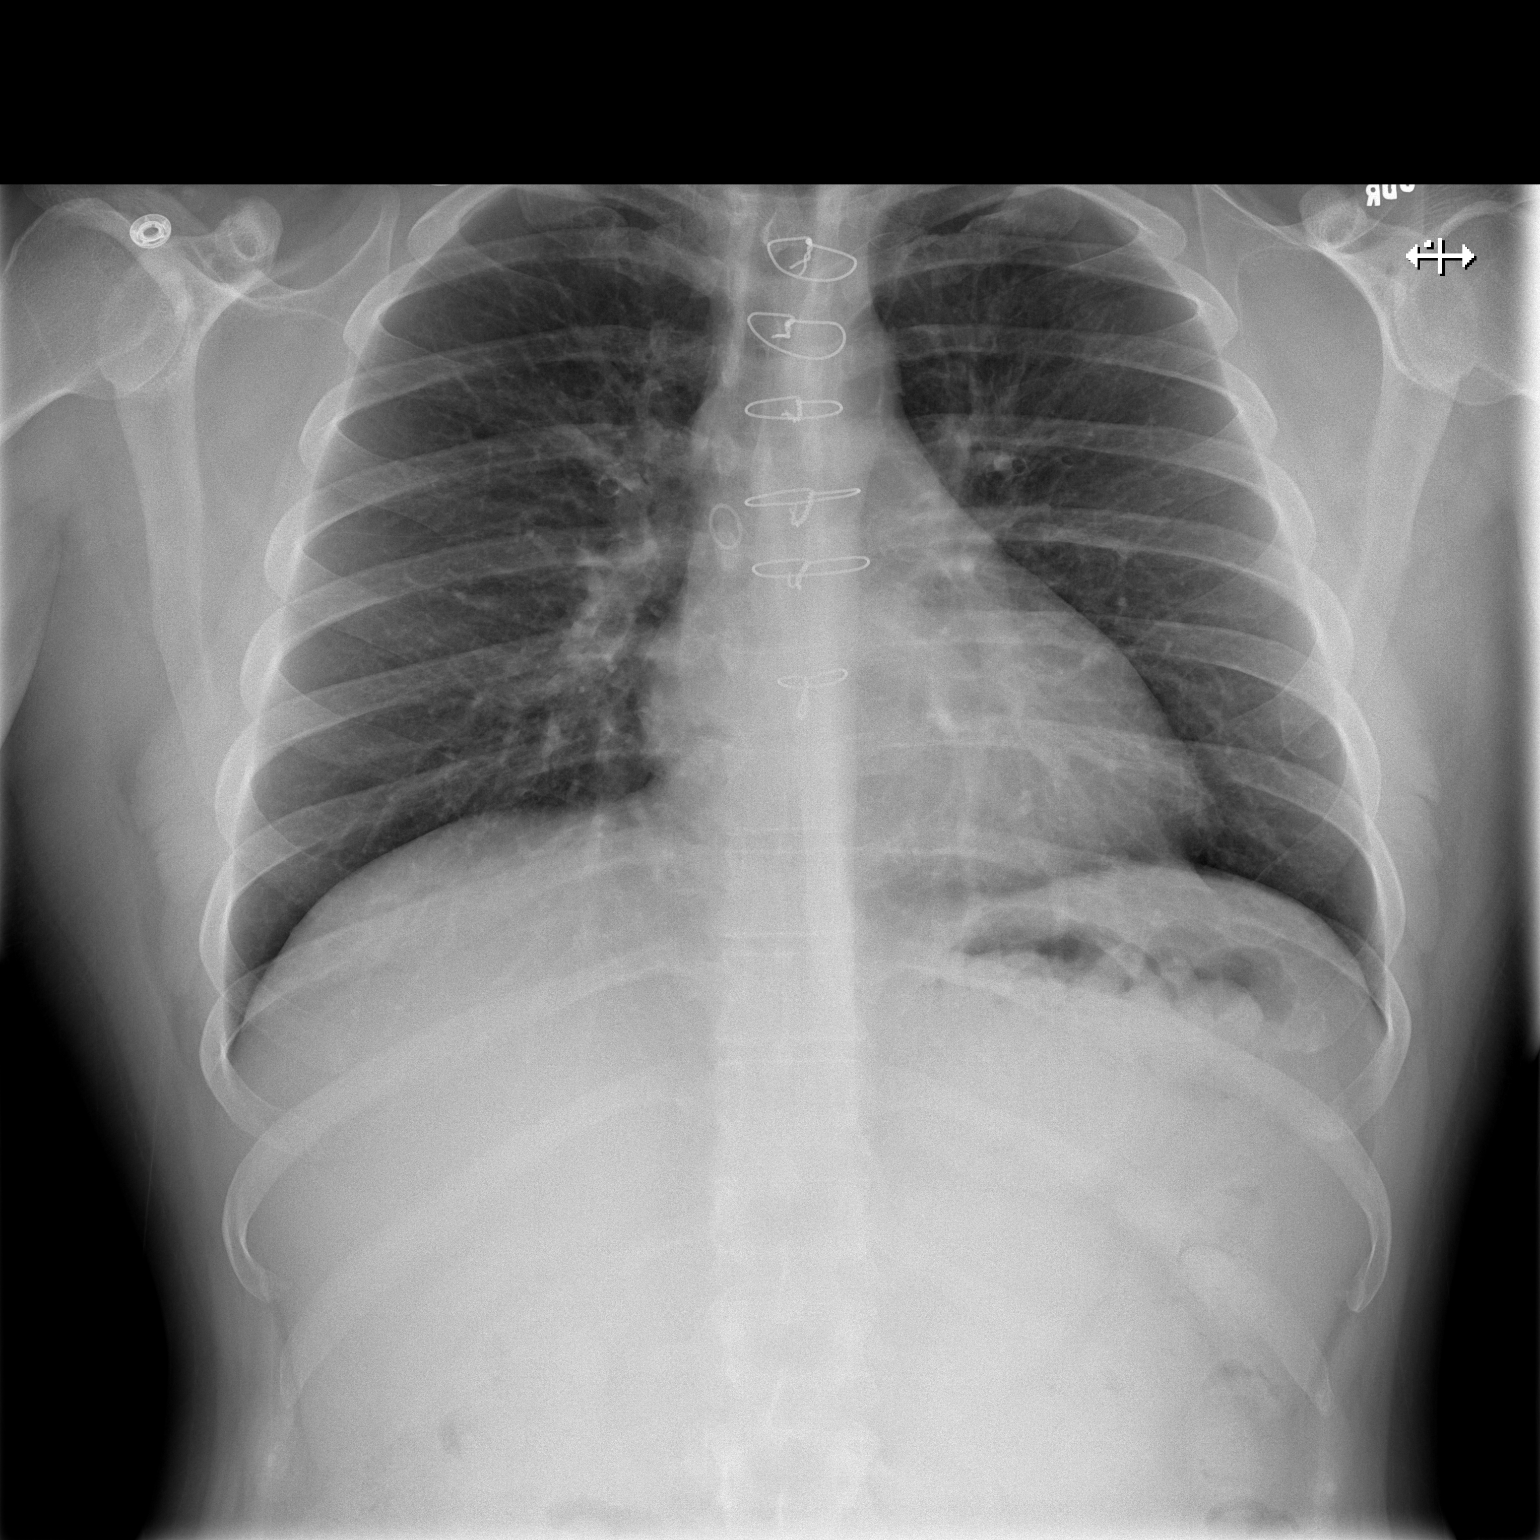

[w chest lat]
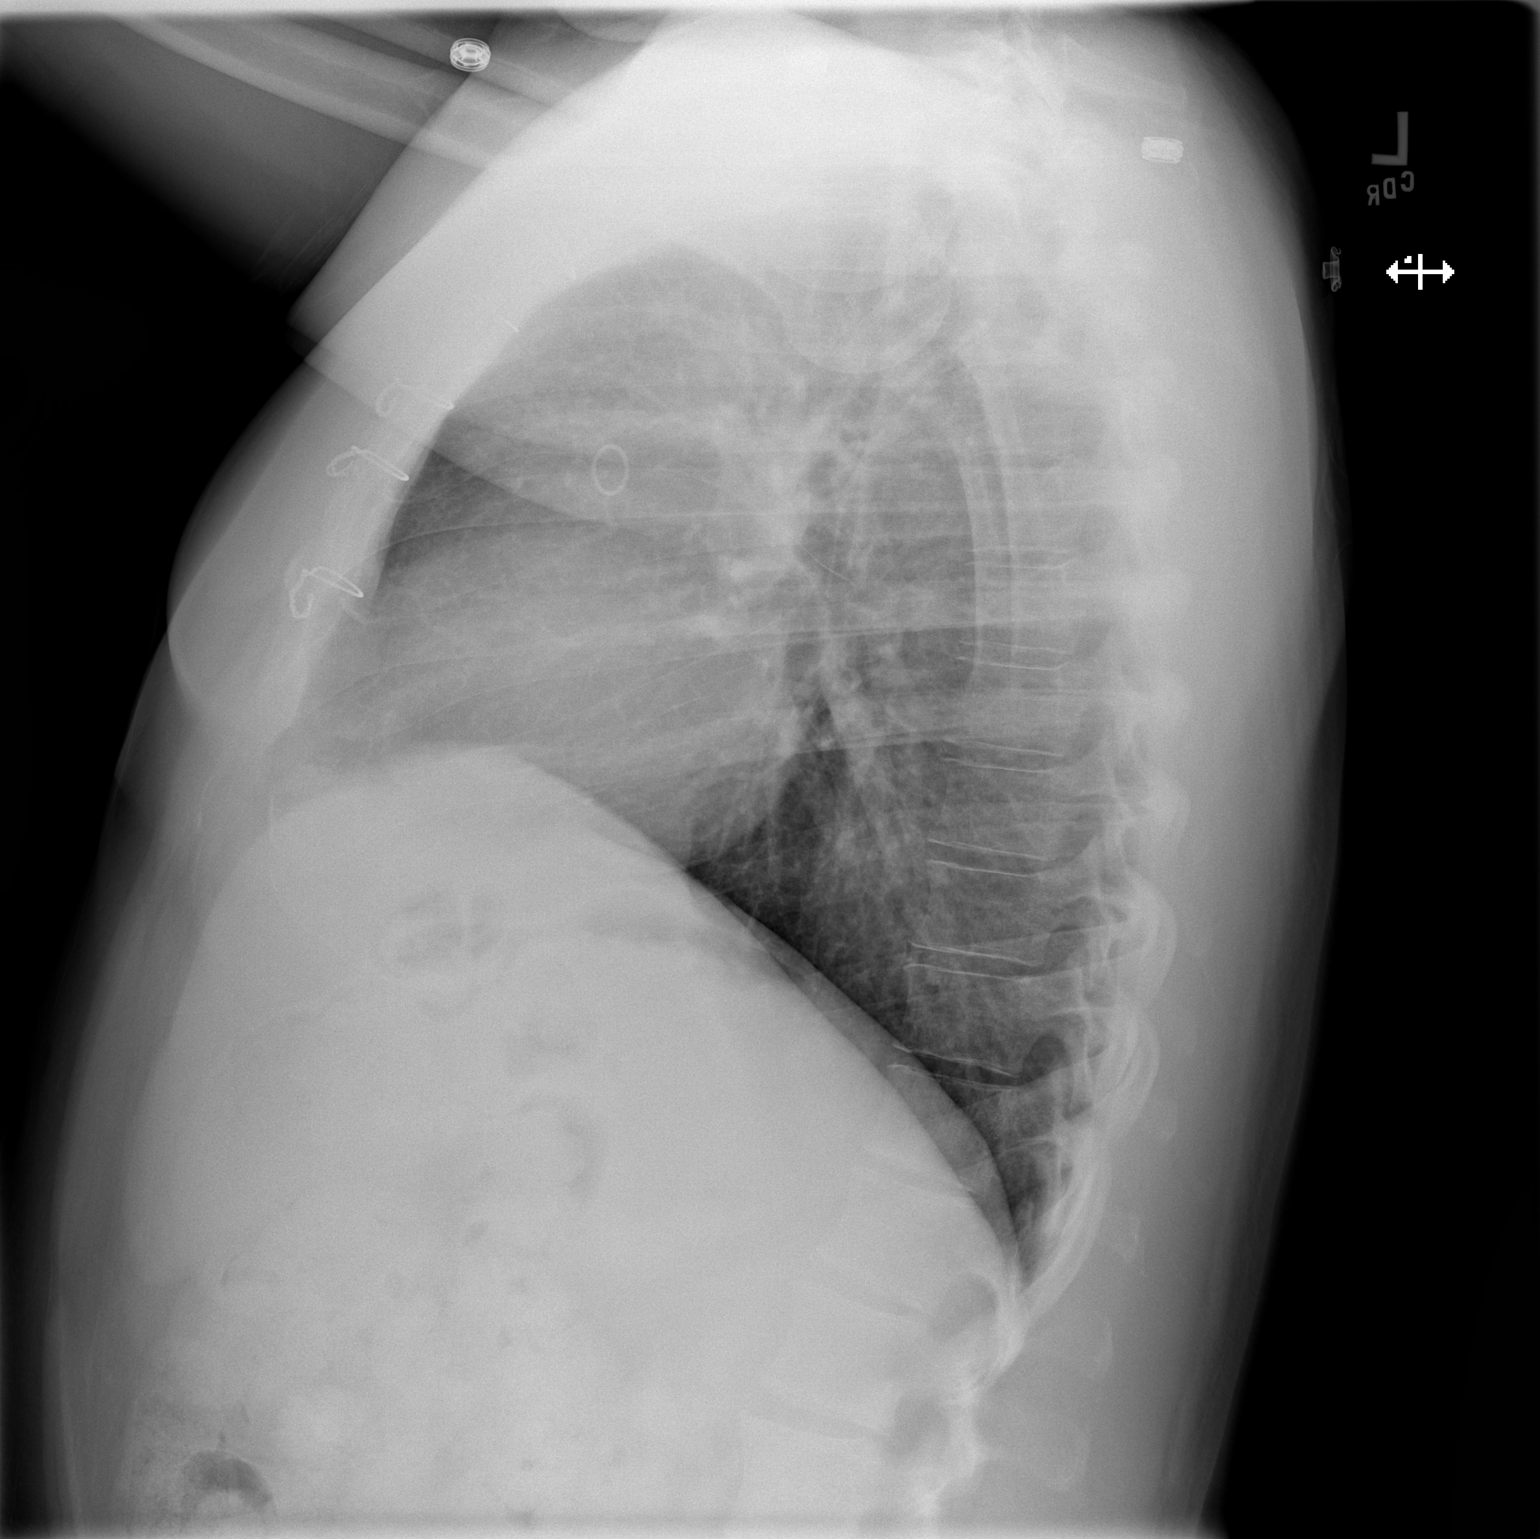

[2 of 2 positions shown; findings below may reference images not displayed]

FINDINGS: There has been previous median sternotomy and CABG.
Heart size is normal.  Mediastinal shadows are normal.  The lungs
are clear.  The vascularity is normal.  No effusions.  No bony
abnormalities.
IMPRESSION: Previous CABG.  No active disease radiographically.

## 2014-06-28 IMAGING — CR DG CHEST 1V PORT
1 series · 1 of 1 positions shown · non-contrast
Comparison: 01/09/2012

CLINICAL DATA: Status post CABG

PORTABLE CHEST - 1 VIEW

[AP]
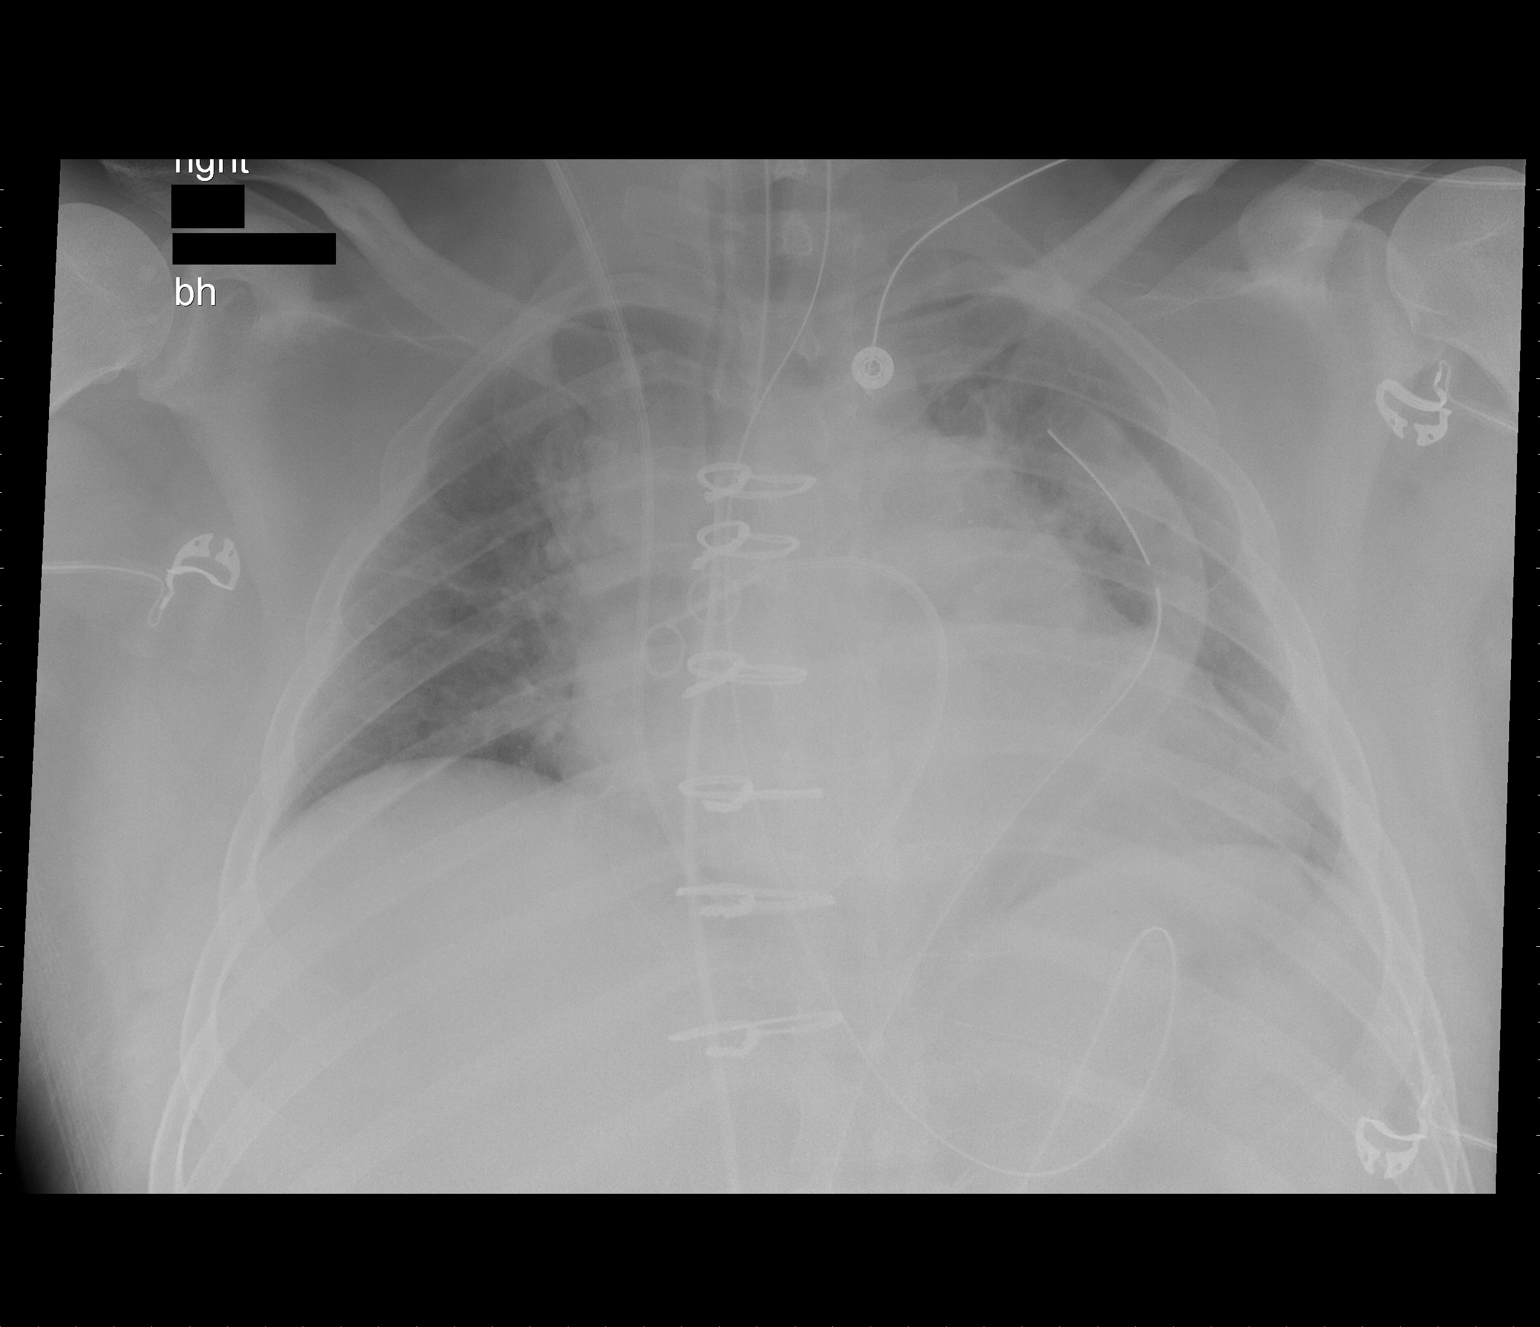

[1 of 1 positions shown; findings below may reference images not displayed]

FINDINGS: Patient is status post CABG.  ET tube, Swan-Ganz
catheter, left chest tube and nasogastric tube all in good
position.  Mild vascular congestion.  No pneumothorax.  No visible
surgical instrument or radiopaque needle is observed.
IMPRESSION: Satisfactory appearance status post CABG.  There is no visible
needle-like structure overlying the chest.  Tubes and lines good
position.  Mild vascular congestion.

## 2014-06-29 IMAGING — CR DG CHEST 1V PORT
1 series · 1 of 1 positions shown · non-contrast
Comparison: 01/10/2012.

CLINICAL DATA: Bypass surgery.

PORTABLE CHEST - 1 VIEW

[AP]
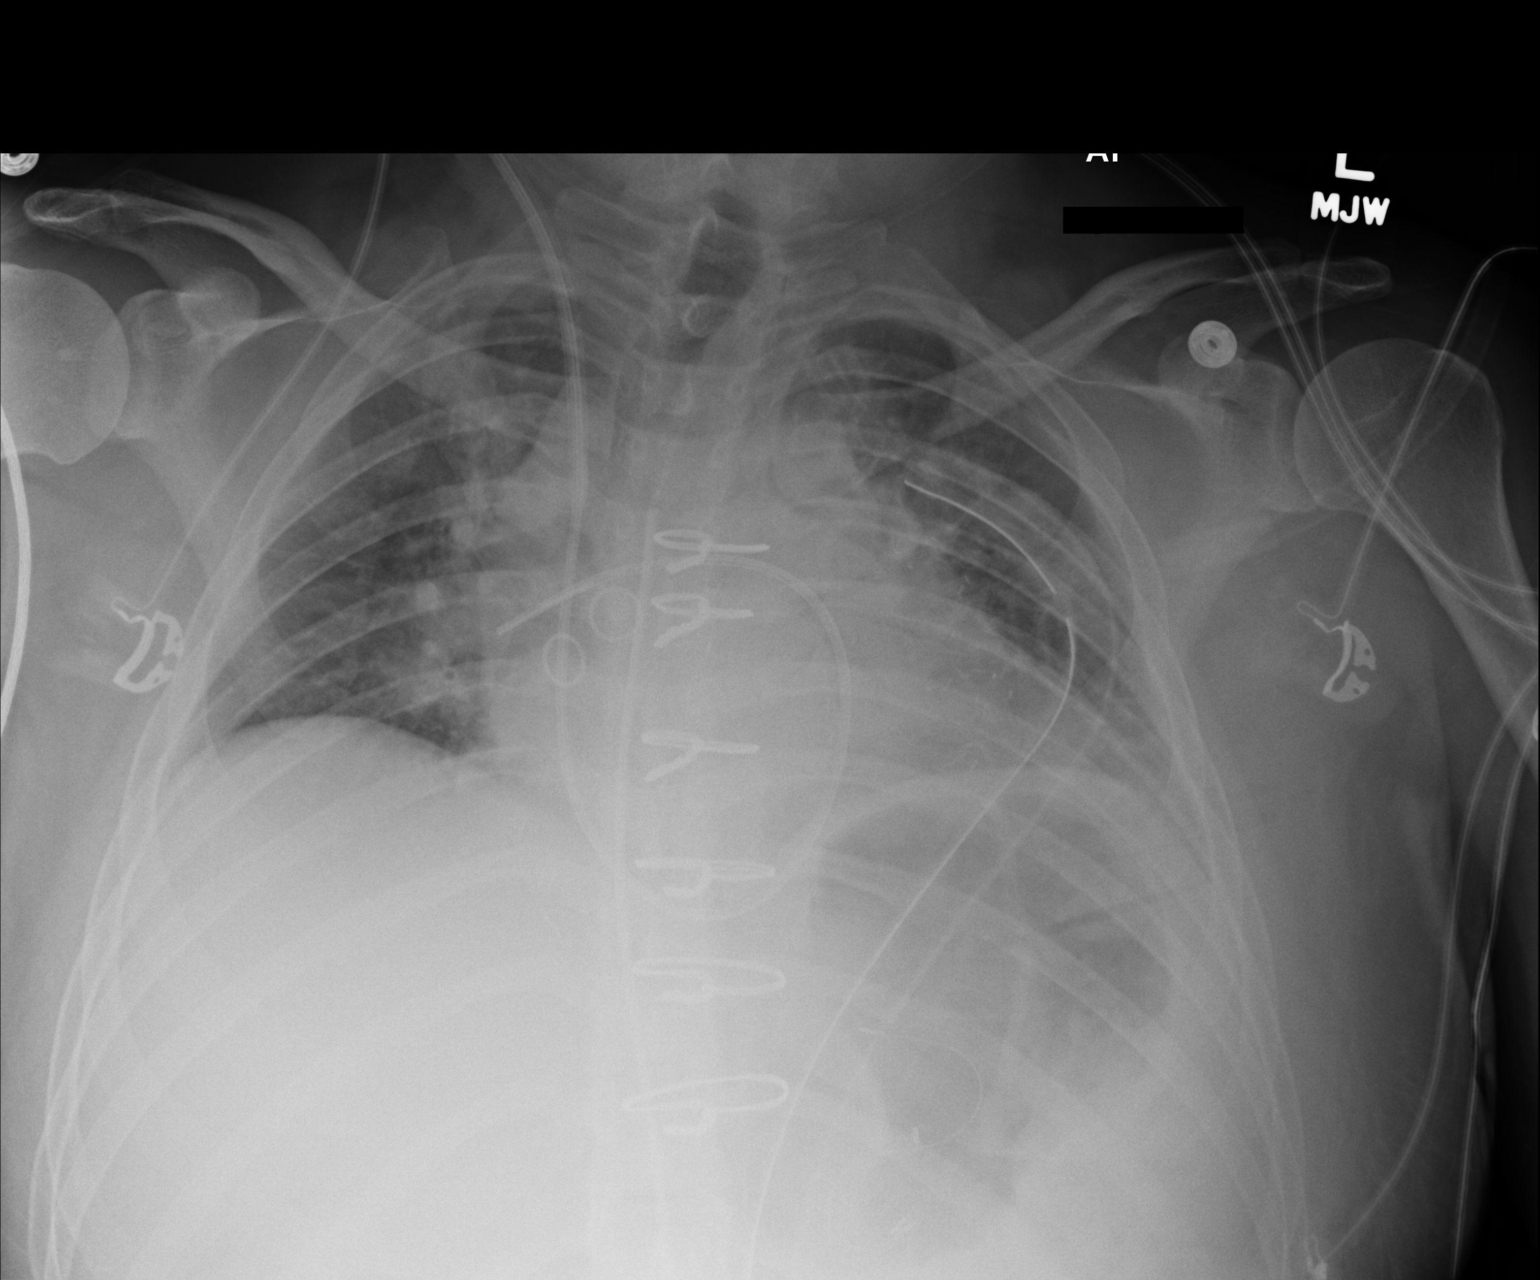

[1 of 1 positions shown; findings below may reference images not displayed]

FINDINGS: The endotracheal tube and NG tubes have been removed.
The right IJ Swan-Ganz catheter and left-sided chest tubes are
stable.  Low lung volumes with vascular crowding and basilar
atelectasis.  Probable mild vascular congestion but no overt
pulmonary edema and no pleural effusions or pneumothorax.
IMPRESSION: 1.  Removal of ET and NG tubes.
2.  Remaining support apparatus is stable.
3.  Low lung volumes with vascular crowding and streaky
atelectasis.

## 2014-06-30 IMAGING — CR DG CHEST 1V PORT
1 series · 1 of 1 positions shown · non-contrast
Comparison: Portable chest x-ray of 01/11/2012

CLINICAL DATA: Postop CABG

PORTABLE CHEST - 1 VIEW

[AP]
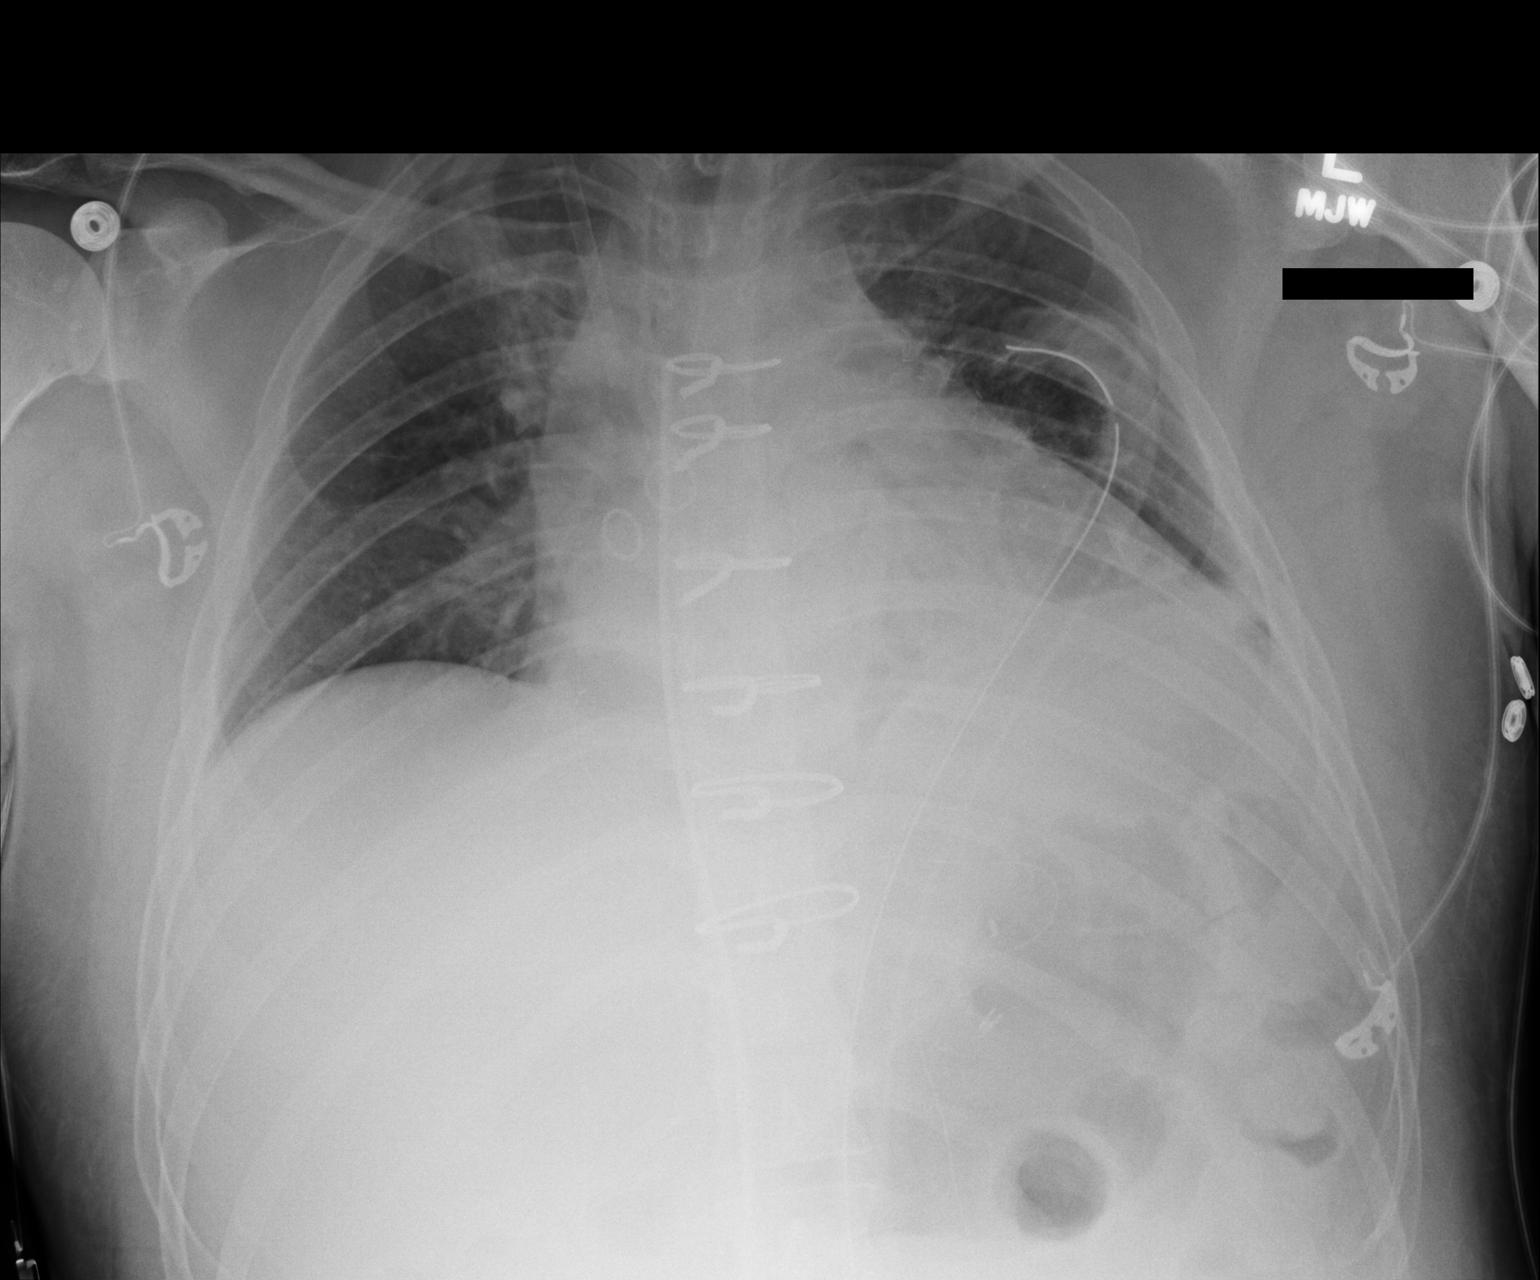

[1 of 1 positions shown; findings below may reference images not displayed]

FINDINGS: Aeration has improved slightly.  The Swan-Ganz catheter
has been removed and a venous sheath remains in the SVC.  A left
chest tube is present.  No pneumothorax is noted.  Cardiomegaly is
stable, and mild basilar atelectasis remain
IMPRESSION: Slightly better aeration.  Swan-Ganz catheter removed.  No
pneumothorax with left chest tube remaining.

## 2015-11-18 NOTE — Progress Notes (Signed)
This encounter was created in error - please disregard.  This encounter was created in error - please disregard.
